# Patient Record
Sex: Male | Born: 1950
Health system: Southern US, Community
[De-identification: ages and names within clinical notes are randomized; demographics above are authoritative.]

## PROBLEM LIST (undated history)

## (undated) DIAGNOSIS — R7303 Prediabetes: Secondary | ICD-10-CM

## (undated) DIAGNOSIS — M546 Pain in thoracic spine: Secondary | ICD-10-CM

## (undated) DIAGNOSIS — F17200 Nicotine dependence, unspecified, uncomplicated: Secondary | ICD-10-CM

## (undated) DIAGNOSIS — M5137 Other intervertebral disc degeneration, lumbosacral region: Secondary | ICD-10-CM

## (undated) DIAGNOSIS — N281 Cyst of kidney, acquired: Secondary | ICD-10-CM

## (undated) DIAGNOSIS — M1711 Unilateral primary osteoarthritis, right knee: Secondary | ICD-10-CM

## (undated) DIAGNOSIS — R030 Elevated blood-pressure reading, without diagnosis of hypertension: Secondary | ICD-10-CM

## (undated) DIAGNOSIS — R911 Solitary pulmonary nodule: Secondary | ICD-10-CM

## (undated) DIAGNOSIS — N400 Enlarged prostate without lower urinary tract symptoms: Secondary | ICD-10-CM

## (undated) DIAGNOSIS — J45909 Unspecified asthma, uncomplicated: Secondary | ICD-10-CM

## (undated) DIAGNOSIS — M1712 Unilateral primary osteoarthritis, left knee: Secondary | ICD-10-CM

## (undated) DIAGNOSIS — B351 Tinea unguium: Secondary | ICD-10-CM

## (undated) DIAGNOSIS — N135 Crossing vessel and stricture of ureter without hydronephrosis: Secondary | ICD-10-CM

## (undated) DIAGNOSIS — S3992XA Unspecified injury of lower back, initial encounter: Secondary | ICD-10-CM

## (undated) DIAGNOSIS — E785 Hyperlipidemia, unspecified: Secondary | ICD-10-CM

## (undated) DIAGNOSIS — I10 Essential (primary) hypertension: Secondary | ICD-10-CM

## (undated) HISTORY — DX: Crossing vessel and stricture of ureter without hydronephrosis: N13.5

## (undated) HISTORY — DX: Essential (primary) hypertension: I10

## (undated) HISTORY — DX: Cyst of kidney, acquired: N28.1

## (undated) HISTORY — DX: Solitary pulmonary nodule: R91.1

## (undated) HISTORY — DX: Hyperlipidemia, unspecified: E78.5

## (undated) HISTORY — DX: Tinea unguium: B35.1

## (undated) HISTORY — DX: Prediabetes: R73.03

## (undated) HISTORY — DX: Unspecified asthma, uncomplicated: J45.909

## (undated) HISTORY — PX: WISDOM TOOTH EXTRACTION: SHX21

## (undated) HISTORY — DX: Unilateral primary osteoarthritis, left knee: M17.12

## (undated) HISTORY — DX: Benign prostatic hyperplasia without lower urinary tract symptoms: N40.0

## (undated) HISTORY — DX: Other intervertebral disc degeneration, lumbosacral region: M51.37

## (undated) HISTORY — DX: Unilateral primary osteoarthritis, right knee: M17.11

## (undated) HISTORY — DX: Pain in thoracic spine: M54.6

## (undated) HISTORY — DX: Elevated blood-pressure reading, without diagnosis of hypertension: R03.0

## (undated) HISTORY — DX: Nicotine dependence, unspecified, uncomplicated: F17.200

---

## 1998-03-20 ENCOUNTER — Encounter: Admission: RE | Admit: 1998-03-20 | Discharge: 1998-03-20 | Payer: Self-pay | Admitting: Family Medicine

## 1998-06-07 ENCOUNTER — Encounter: Admission: RE | Admit: 1998-06-07 | Discharge: 1998-06-07 | Payer: Self-pay | Admitting: Family Medicine

## 1998-12-25 ENCOUNTER — Encounter: Admission: RE | Admit: 1998-12-25 | Discharge: 1998-12-25 | Payer: Self-pay | Admitting: Sports Medicine

## 2001-05-18 ENCOUNTER — Encounter: Admission: RE | Admit: 2001-05-18 | Discharge: 2001-05-18 | Payer: Self-pay | Admitting: Family Medicine

## 2002-11-11 ENCOUNTER — Encounter: Admission: RE | Admit: 2002-11-11 | Discharge: 2002-11-11 | Payer: Self-pay | Admitting: Sports Medicine

## 2007-09-14 ENCOUNTER — Emergency Department (HOSPITAL_COMMUNITY): Admission: EM | Admit: 2007-09-14 | Discharge: 2007-09-14 | Payer: Self-pay | Admitting: Emergency Medicine

## 2007-11-29 ENCOUNTER — Emergency Department (HOSPITAL_COMMUNITY): Admission: EM | Admit: 2007-11-29 | Discharge: 2007-11-29 | Payer: Self-pay | Admitting: Family Medicine

## 2007-12-01 ENCOUNTER — Ambulatory Visit: Payer: Self-pay | Admitting: Family Medicine

## 2007-12-02 ENCOUNTER — Encounter: Payer: Self-pay | Admitting: *Deleted

## 2007-12-06 ENCOUNTER — Telehealth: Payer: Self-pay | Admitting: *Deleted

## 2007-12-06 ENCOUNTER — Encounter: Admission: RE | Admit: 2007-12-06 | Discharge: 2007-12-06 | Payer: Self-pay | Admitting: Sports Medicine

## 2007-12-06 ENCOUNTER — Ambulatory Visit: Payer: Self-pay | Admitting: Family Medicine

## 2007-12-13 ENCOUNTER — Ambulatory Visit: Payer: Self-pay | Admitting: Family Medicine

## 2007-12-13 ENCOUNTER — Encounter: Payer: Self-pay | Admitting: Family Medicine

## 2007-12-14 ENCOUNTER — Encounter: Admission: RE | Admit: 2007-12-14 | Discharge: 2008-02-08 | Payer: Self-pay | Admitting: Family Medicine

## 2007-12-21 ENCOUNTER — Encounter: Payer: Self-pay | Admitting: Family Medicine

## 2007-12-21 ENCOUNTER — Ambulatory Visit: Payer: Self-pay | Admitting: Family Medicine

## 2007-12-21 DIAGNOSIS — M545 Low back pain, unspecified: Secondary | ICD-10-CM | POA: Insufficient documentation

## 2007-12-29 ENCOUNTER — Telehealth: Payer: Self-pay | Admitting: Family Medicine

## 2007-12-29 ENCOUNTER — Telehealth: Payer: Self-pay | Admitting: *Deleted

## 2007-12-30 ENCOUNTER — Encounter: Payer: Self-pay | Admitting: Family Medicine

## 2008-01-03 ENCOUNTER — Encounter: Payer: Self-pay | Admitting: Family Medicine

## 2008-01-12 ENCOUNTER — Encounter: Payer: Self-pay | Admitting: Family Medicine

## 2008-01-13 ENCOUNTER — Encounter: Payer: Self-pay | Admitting: *Deleted

## 2008-01-17 ENCOUNTER — Ambulatory Visit: Payer: Self-pay | Admitting: Family Medicine

## 2008-01-17 ENCOUNTER — Ambulatory Visit (HOSPITAL_COMMUNITY): Admission: RE | Admit: 2008-01-17 | Discharge: 2008-01-17 | Payer: Self-pay | Admitting: Emergency Medicine

## 2008-01-18 ENCOUNTER — Telehealth: Payer: Self-pay | Admitting: Family Medicine

## 2008-01-18 ENCOUNTER — Encounter: Payer: Self-pay | Admitting: Family Medicine

## 2008-01-21 ENCOUNTER — Encounter: Payer: Self-pay | Admitting: Family Medicine

## 2008-03-03 ENCOUNTER — Telehealth: Payer: Self-pay | Admitting: Family Medicine

## 2008-03-10 ENCOUNTER — Emergency Department (HOSPITAL_COMMUNITY): Admission: EM | Admit: 2008-03-10 | Discharge: 2008-03-10 | Payer: Self-pay | Admitting: Family Medicine

## 2008-03-14 ENCOUNTER — Ambulatory Visit: Payer: Self-pay | Admitting: Family Medicine

## 2008-06-09 HISTORY — PX: COLONOSCOPY: SHX174

## 2009-03-17 ENCOUNTER — Encounter (INDEPENDENT_AMBULATORY_CARE_PROVIDER_SITE_OTHER): Payer: Self-pay | Admitting: *Deleted

## 2009-03-17 DIAGNOSIS — F17200 Nicotine dependence, unspecified, uncomplicated: Secondary | ICD-10-CM | POA: Insufficient documentation

## 2009-03-17 DIAGNOSIS — Z87891 Personal history of nicotine dependence: Secondary | ICD-10-CM | POA: Insufficient documentation

## 2009-03-17 HISTORY — DX: Nicotine dependence, unspecified, uncomplicated: F17.200

## 2009-06-09 DIAGNOSIS — S3992XA Unspecified injury of lower back, initial encounter: Secondary | ICD-10-CM

## 2009-06-09 HISTORY — DX: Unspecified injury of lower back, initial encounter: S39.92XA

## 2009-12-24 ENCOUNTER — Emergency Department (HOSPITAL_COMMUNITY): Admission: EM | Admit: 2009-12-24 | Discharge: 2009-12-24 | Payer: Self-pay | Admitting: Emergency Medicine

## 2009-12-28 ENCOUNTER — Ambulatory Visit: Payer: Self-pay | Admitting: Internal Medicine

## 2009-12-28 ENCOUNTER — Ambulatory Visit (HOSPITAL_COMMUNITY): Admission: RE | Admit: 2009-12-28 | Discharge: 2009-12-28 | Payer: Self-pay | Admitting: Internal Medicine

## 2009-12-28 DIAGNOSIS — M51379 Other intervertebral disc degeneration, lumbosacral region without mention of lumbar back pain or lower extremity pain: Secondary | ICD-10-CM

## 2009-12-28 DIAGNOSIS — M5137 Other intervertebral disc degeneration, lumbosacral region: Secondary | ICD-10-CM

## 2009-12-28 DIAGNOSIS — N400 Enlarged prostate without lower urinary tract symptoms: Secondary | ICD-10-CM

## 2009-12-28 DIAGNOSIS — M25559 Pain in unspecified hip: Secondary | ICD-10-CM

## 2009-12-28 DIAGNOSIS — R9389 Abnormal findings on diagnostic imaging of other specified body structures: Secondary | ICD-10-CM

## 2009-12-28 HISTORY — DX: Other intervertebral disc degeneration, lumbosacral region without mention of lumbar back pain or lower extremity pain: M51.379

## 2009-12-28 HISTORY — DX: Other intervertebral disc degeneration, lumbosacral region: M51.37

## 2009-12-28 HISTORY — DX: Benign prostatic hyperplasia without lower urinary tract symptoms: N40.0

## 2009-12-31 ENCOUNTER — Encounter: Payer: Self-pay | Admitting: Internal Medicine

## 2010-01-07 ENCOUNTER — Telehealth: Payer: Self-pay | Admitting: Internal Medicine

## 2010-02-12 ENCOUNTER — Ambulatory Visit: Payer: Self-pay | Admitting: Internal Medicine

## 2010-02-12 DIAGNOSIS — M546 Pain in thoracic spine: Secondary | ICD-10-CM | POA: Insufficient documentation

## 2010-02-12 HISTORY — DX: Pain in thoracic spine: M54.6

## 2010-02-13 ENCOUNTER — Ambulatory Visit: Payer: Self-pay | Admitting: Internal Medicine

## 2010-02-14 ENCOUNTER — Encounter: Payer: Self-pay | Admitting: Internal Medicine

## 2010-03-04 ENCOUNTER — Encounter: Payer: Self-pay | Admitting: Internal Medicine

## 2010-03-20 ENCOUNTER — Encounter
Admission: RE | Admit: 2010-03-20 | Discharge: 2010-05-28 | Payer: Self-pay | Source: Home / Self Care | Attending: Physical Medicine and Rehabilitation | Admitting: Physical Medicine and Rehabilitation

## 2010-04-30 ENCOUNTER — Ambulatory Visit (HOSPITAL_COMMUNITY)
Admission: RE | Admit: 2010-04-30 | Discharge: 2010-04-30 | Payer: Self-pay | Source: Home / Self Care | Admitting: Physical Medicine and Rehabilitation

## 2010-05-06 ENCOUNTER — Encounter: Payer: Self-pay | Admitting: Internal Medicine

## 2010-05-27 ENCOUNTER — Encounter: Payer: Self-pay | Admitting: Internal Medicine

## 2010-06-09 HISTORY — PX: OTHER SURGICAL HISTORY: SHX169

## 2010-06-11 ENCOUNTER — Ambulatory Visit (HOSPITAL_COMMUNITY)
Admission: RE | Admit: 2010-06-11 | Discharge: 2010-06-11 | Payer: Self-pay | Source: Home / Self Care | Attending: Urology | Admitting: Urology

## 2010-06-18 ENCOUNTER — Encounter: Payer: Self-pay | Admitting: Internal Medicine

## 2010-06-28 ENCOUNTER — Encounter: Payer: Self-pay | Admitting: Internal Medicine

## 2010-07-05 LAB — CBC
HCT: 41.8 % (ref 39.0–52.0)
Hemoglobin: 14 g/dL (ref 13.0–17.0)
MCHC: 33.5 g/dL (ref 30.0–36.0)
MCV: 85.7 fL (ref 78.0–100.0)
RDW: 13.4 % (ref 11.5–15.5)

## 2010-07-05 LAB — BASIC METABOLIC PANEL
BUN: 16 mg/dL (ref 6–23)
CO2: 28 mEq/L (ref 19–32)
GFR calc non Af Amer: >60 mL/min (ref >60)
Glucose, Bld: 101 mg/dL — ABNORMAL HIGH (ref 70–99)
Potassium: 4.9 mEq/L (ref 3.5–5.1)

## 2010-07-09 NOTE — Letter (Signed)
Summary: Out of Work  [facility]  [address]   [place], [place] [address]   Phone: [phone]  Fax: [phone]    January 18, 2008   Employee:  Samuel Gross [facility]    To Whom It May Concern:   For Medical reasons, please excuse the above named employee from work for the following dates:  Start:   May return to work 4 hours on 01/19/08, then to begin full time on 01/24/08.     If you need additional information, please feel free to contact our office.         Sincerely,    Luretha Murphy NP

## 2010-07-09 NOTE — Assessment & Plan Note (Signed)
Summary: Ongoing back pain-? referral/scm   Vital Signs:  Patient profile:   60 year old male Weight:      154 pounds Temp:     99.2 degrees F oral Pulse rate:   76 / minute Resp:     16 per minute BP sitting:   138 / 80  (left arm) Cuff size:   large  Vitals Entered By: Shonna Chock CMA (February 12, 2010 3:48 PM) CC: Ongoin back pain-? referral, Back pain   CC:  Ongoin back pain-? referral and Back pain.  History of Present Illness: Back Pain      This is a 60 year old man who presents with Back pain intermittently for 2-3 years. Initially it was in  R low back after lifting @ work. He was out of work 12 weeks ; he was in Physical Therapy for 4-5 weeks. .  The patient reports loss of sensation intermittently in L thigh, but denies fever, chills, weakness, fecal incontinence, urinary incontinence, urinary retention, dysuria, inability to work, and inability to care for self.  The pain is located in the left low back.  The pain  was exacerbated  at work necessitating job absence for 1 day two weeks ago . 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 The pain is made worse by standing or lifting @ work  and activity.  The pain is made better by inactivity, NSAID medications, muscle relaxants, and opioids ; but only after 6 hrs.  Risk factors for serious underlying conditions include age >= 50 years. Prior hip & LS spine reprts reveal mild degenerative changes.  Current Medications (verified): 1)  Ibuprofen 800 Mg  Tabs (Ibuprofen) .... Two Times A Day 2)  Flexeril 10 Mg  Tabs (Cyclobenzaprine Hcl) .... Three Times A Day As Needed 3)  Oxycodone-Acetaminophen 5-325 Mg Tabs (Oxycodone-Acetaminophen) .Marland Kitchen.. 1 By Mouth Every 4-6 Hours As Needed  Allergies (verified): No Known Drug Allergies  Review of Systems General:  Denies weight loss. GI:  Denies abdominal pain, bloody stools, and dark tarry stools. GU:  Denies discharge and hematuria. Neuro:  Denies brief paralysis, disturbances in  coordination, falling down, and poor balance.  Physical Exam  General:  Thin but well-nourished,in no acute distress; alert,appropriate and cooperative throughout examination Abdomen:  Bowel sounds positive,abdomen soft and non-tender without masses, organomegaly or hernias noted. Msk:  Lay down & sat up w/o help . R thoracic musculature larger than L (R/O subclinical scoliosis) Neurologic:  alert & oriented X3, strength normal in all extremities except minimal weakness L thigh to opposition , and DTRs symmetrical and normal.   Cervical Nodes:  No lymphadenopathy noted Axillary Nodes:  No palpable lymphadenopathy Psych:  memory intact for recent and remote, normally interactive, and good eye contact.     Detailed Back/Spine Exam  Gait:    Normal heel-toe gait pattern bilaterally.    Skin:    Intact with no erythema; no scarring.    Inspection:     normal alignment of leg, ankle, hindfoot, and foot.   Vascular:    dorsalis pedis and posterior tibial pulses 2+ and symmetric   Impression & Recommendations:  Problem # 1:  BACK PAIN, THORACIC REGION (ICD-724.1)  R/O Scoliosis His updated medication list for this problem includes:    Ibuprofen 800 Mg Tabs (Ibuprofen) .Marland Kitchen..Marland Kitchen Two times a day    Flexeril 10 Mg Tabs (Cyclobenzaprine hcl) .Marland Kitchen... 1/2 two times a day & 1/2 -1 at bedtime as needed when not working  Oxycodone-acetaminophen 5-325 Mg Tabs (Oxycodone-acetaminophen) .Marland Kitchen... 1 by mouth every 4-6 hours as needed  Orders: T-Thoracic Spine 2 Views [phone]) Orthopedic Referral (Ortho)  Problem # 2:  BACK PAIN, LUMBAR (ICD-724.2)  Mild degenerative changes on Xray 2009 His updated medication list for this problem includes:    Ibuprofen 800 Mg Tabs (Ibuprofen) .Marland Kitchen..Marland Kitchen Two times a day    Flexeril 10 Mg Tabs (Cyclobenzaprine hcl) .Marland Kitchen... 1/2 two times a day & 1/2 -1 at bedtime as needed when not working    Oxycodone-acetaminophen 5-325 Mg Tabs (Oxycodone-acetaminophen) .Marland Kitchen... 1 by  mouth every 4-6 hours as needed  Orders: Orthopedic Referral (Ortho)  Complete Medication List: 1)  Ibuprofen 800 Mg Tabs (Ibuprofen) .... Two times a day 2)  Flexeril 10 Mg Tabs (Cyclobenzaprine hcl) .... 1/2 two times a day & 1/2 -1 at bedtime as needed when not working 3)  Oxycodone-acetaminophen 5-325 Mg Tabs (Oxycodone-acetaminophen) .Marland Kitchen.. 1 by mouth every 4-6 hours as needed  Patient Instructions: 1)  Please see Dr Ethelene Hal as scheduled. Prescriptions: FLEXERIL 10 MG  TABS (CYCLOBENZAPRINE HCL) 1/2 two times a day & 1/2 -1 at bedtime as needed when not working  #30 x 0   Entered and Authorized by:   Marga Melnick MD   Signed by:   Marga Melnick MD on 02/12/2010   Method used:   Print then Give to Patient   RxID:   [id number] OXYCODONE-ACETAMINOPHEN 5-325 MG TABS (OXYCODONE-ACETAMINOPHEN) 1 by mouth every 4-6 hours as needed  #30 x 0   Entered and Authorized by:   Marga Melnick MD   Signed by:   Marga Melnick MD on 02/12/2010   Method used:   Print then Give to Patient   RxID:   [id number]

## 2010-07-09 NOTE — Consult Note (Signed)
Summary: [facility]  [facility]   Imported By: Lanelle Bal 03/15/2010 13:50:20  _____________________________________________________________________  External Attachment:    Type:   Image     Comment:   External Document

## 2010-07-09 NOTE — Assessment & Plan Note (Signed)
Summary: new to est//kn   Vital Signs:  Patient profile:   60 year old male Height:      70.75 inches Weight:      158.8 pounds BMI:     22.39 Temp:     98.7 degrees F oral Pulse rate:   72 / minute Resp:     14 per minute BP sitting:   118 / 76  (left arm) Cuff size:   large  Vitals Entered By: Shonna Chock CMA (December 28, 2009 2:26 PM)  CC: New patient establish CPX, f/u from ER on lower back pain (seen Monday), frequent urination durning the night (not a steady flow), General Medical Evaluation, Lower Extremity Joint pain   CC:  New patient establish CPX, f/u from ER on lower back pain (seen Monday), frequent urination durning the night (not a steady flow), General Medical Evaluation, and Lower Extremity Joint pain.  History of Present Illness: Samuel Gross is here to establish as a new patient; he has been having L flank/ hip pain X 6 months.   The patient reports weakness, but denies swelling, redness, giving away, locking, popping, stiffness for >1 hr, and decreased ROM.  The pain is located @ & above the left hip.  The pain began gradually and with no injury.  The pain is described as aching, intermittent, and occuring mainly  at rest. No evaluation to date ;he had MRI of LS spine 12 months for R sided injury. He was out of work 8 weeks.  The patient denies the following symptoms: fever, rash, photosensitivity, eye symptoms, diarrhea, and dysuria.    Preventive Screening-Counseling & Management  Caffeine-Diet-Exercise     Does Patient Exercise: yes  Current Medications (verified): 1)  Ibuprofen 800 Mg  Tabs (Ibuprofen) .... Two Times A Day 2)  Flexeril 10 Mg  Tabs (Cyclobenzaprine Hcl) .... Three Times A Day As Needed 3)  Oxycodone-Acetaminophen 5-325 Mg Tabs (Oxycodone-Acetaminophen) .Marland Kitchen.. 1 By Mouth Every 4-6 Hours As Needed  Allergies (verified): No Known Drug Allergies  Past History:  Past Medical History: RAD  post "walking PNA" , as needed albuterol; DDD LS spine ( disc  extrusion L3-4; protrusion L4-5) 2009; L peri renal cyst (incidental finding )2009  Past Surgical History: Wisdom Teeth Extraction  Family History: DM - M died of complications, HTN - F, 3 bro, 2 sis  Social History: Married to [place]; Smokes 1/2 PPD; Alcohol 2-3 mixed drinks on Saturdays. Occupation:Transporter @  [employer] Regular exercise-yes on job Does Patient Exercise:  yes  Review of Systems  The patient denies anorexia, vision loss, decreased hearing, hoarseness, headaches, abdominal pain, melena, hematochezia, severe indigestion/heartburn, suspicious skin lesions, depression, unusual weight change, abnormal bleeding, enlarged lymph nodes, and angioedema.   General:  Denies chills, sweats, and weight loss. Eyes:  Denies discharge, eye pain, red eye, and vision loss-both eyes. Resp:  Denies chest pain with inspiration, cough, coughing up blood, shortness of breath, and sputum productive; Occasioanl wheezing when supine; no albuterol use x 12 months. GU:  Denies discharge, hematuria, and incontinence. Neuro:  Complains of numbness and tingling; denies disturbances in coordination and poor balance; N&T slightly L hip area.  Physical Exam  General:  Thin but well-nourished; alert,appropriate and cooperative throughout examination Head:  Normocephalic and atraumatic without obvious abnormalities. No apparent alopecia ; moustache Eyes:  No corneal or conjunctival inflammation noted. Perrla. Funduscopic exam benign, without hemorrhages, exudates or papilledema.  Ears:  External ear exam shows no significant lesions or deformities.  Otoscopic  examination reveals wax bilaterally.Hearing is grossly normal bilaterally. Nose:  External nasal examination shows no deformity or inflammation. Nasal mucosa are pink and moist without lesions or exudates. Mouth:  Oral mucosa and oropharynx without lesions or exudates.  Upper partial Neck:  No deformities, masses, or tenderness noted. R thyroid  lobe > L, no nodules Lungs:  Normal respiratory effort, chest expands symmetrically. Lungs are clear to auscultation, no crackles or wheezes but BS slightly decreased Heart:  normal rate, regular rhythm, no gallop, no rub, no JVD, no HJR, and grade 1 /6 systolic murmur.   Abdomen:  Bowel sounds positive,abdomen soft and non-tender without masses, organomegaly or hernias noted. Rectal:  No external abnormalities noted. Normal sphincter tone. No rectal masses or tenderness. Genitalia:  Testes bilaterally descended without nodularity, tenderness or masses. No scrotal masses or lesions. No penis lesions or urethral discharge. Prostate:  no nodules ; R  1.5 -2+ asymmetrical enlargement.  L WNL.  Msk:  No deformity or scoliosis noted of thoracic or lumbar spine.   Pulses:  R and L carotid,radial,dorsalis pedis and posterior tibial pulses are full and equal bilaterally Extremities:  No clubbing, cyanosis, edema, or deformity noted with normal full range of motion of all joints.  neg SLR  Neurologic:  alert & oriented X3, strength normal in all extremities, and DTRs symmetrical and normal.   Skin:  Intact without suspicious lesions or rashes Cervical Nodes:  No lymphadenopathy noted Axillary Nodes:  No palpable lymphadenopathy Inguinal Nodes:  No significant adenopathy Psych:  memory intact for recent and remote, normally interactive, and good eye contact.     Impression & Recommendations:  Problem # 1:  ROUTINE GENERAL MEDICAL EXAM@HEALTH  CARE FACL (ICD-V70.0)  Orders: EKG w/ Interpretation (93000)  Problem # 2:  HIP PAIN, LEFT (ICD-719.45)  His updated medication list for this problem includes:    Ibuprofen 800 Mg Tabs (Ibuprofen) .Marland Kitchen..Marland Kitchen Two times a day    Flexeril 10 Mg Tabs (Cyclobenzaprine hcl) .Marland Kitchen... Three times a day as needed    Oxycodone-acetaminophen 5-325 Mg Tabs (Oxycodone-acetaminophen) .Marland Kitchen... 1 by mouth every 4-6 hours as needed  Orders: T-Hip Comp Left Min 2-views  (73510TC)  Problem # 3:  HYPERPLASIA PROSTATE UNS W/O UR OBST & OTH LUTS (ICD-600.90)  Problem # 4:  DISC DISEASE, LUMBAR (ICD-722.52) PMH of  Problem # 5:  NONSPECIFIC ABN FINDING RAD & OTH EXAM GU ORGAN (ICD-793.5)  L peri renal cyst, incidental finding  Complete Medication List: 1)  Ibuprofen 800 Mg Tabs (Ibuprofen) .... Two times a day 2)  Flexeril 10 Mg Tabs (Cyclobenzaprine hcl) .... Three times a day as needed 3)  Oxycodone-acetaminophen 5-325 Mg Tabs (Oxycodone-acetaminophen) .Marland Kitchen.. 1 by mouth every 4-6 hours as needed  Patient Instructions: 1)   Orthopedic consult if Xrays are negative.Schedule fasting labs : 2)  BMP ; 3)  Hepatic Panel ; 4)  Lipid Panel ; 5)  TSH ; 6)  CBC w/ Diff ; 7)  PSA; 8)  Urine-dip .

## 2010-07-09 NOTE — Progress Notes (Signed)
Summary: Refill Request: Controlled meds  Phone Note Refill Request Message from:  Patient on January 07, 2010 9:23 AM  Refills Requested: Medication #1:  OXYCODONE-ACETAMINOPHEN 5-325 MG TABS 1 by mouth every 4-6 hours as needed.   Dosage confirmed as above?Dosage Confirmed   Supply Requested: 1 month  Medication #2:  IBUPROFEN 800 MG  TABS two times a day   Dosage confirmed as above?Dosage Confirmed   Supply Requested: 1 month  Medication #3:  FLEXERIL 10 MG  TABS three times a day as needed   Dosage confirmed as above?Dosage Confirmed   Supply Requested: 1 month [facility] on [address].  Next Appointment Scheduled: none Initial call taken by: Lavell Islam,  January 07, 2010 9:24 AM  Follow-up for Phone Call        Dr.Shaunie Boehm please advise on refill request, we have never filled these meds -? dispense number Follow-up by: Shonna Chock CMA,  January 07, 2010 11:18 AM  Additional Follow-up for Phone Call Additional follow up Details #1::        Each can  be filled once @ # 30; but I will refer him to  a back specialist as we discussed @ his appt. I recommend  Dr Sheran Luz unless he has a preference. Alos see lab results Additional Follow-up by: Marga Melnick MD,  January 07, 2010 1:04 PM    Additional Follow-up for Phone Call Additional follow up Details #2::    I spoke with Bjorn Loser, patient's wife as indicated on his file-ok to speak with wife, and gave her Dr.Dwayne Begay's instruction to pass to patient Follow-up by: Shonna Chock CMA,  January 07, 2010 2:52 PM  Prescriptions: OXYCODONE-ACETAMINOPHEN 5-325 MG TABS (OXYCODONE-ACETAMINOPHEN) 1 by mouth every 4-6 hours as needed  #30 x 0   Entered by:   Shonna Chock CMA   Authorized by:   Marga Melnick MD   Signed by:   Shonna Chock CMA on 01/07/2010   Method used:   Printed then faxed to ...         RxID:   [id number] FLEXERIL 10 MG  TABS (CYCLOBENZAPRINE HCL) three times a day as needed  #30 x 0   Entered by:   Shonna Chock CMA   Authorized by:   Marga Melnick MD   Signed by:   Shonna Chock CMA on 01/07/2010   Method used:   Printed then faxed to ...         RxID:   [id number] IBUPROFEN 800 MG  TABS (IBUPROFEN) two times a day  #30 x 0   Entered by:   Shonna Chock CMA   Authorized by:   Marga Melnick MD   Signed by:   Shonna Chock CMA on 01/07/2010   Method used:   Printed then faxed to ...         RxID:   [id number]   Appended Document: Refill Request: Controlled meds   Left message on VM informing patient rx is at the front for pick-up./[facility] CMA  January 07, 2010 5:25 PM  Prescriptions: OXYCODONE-ACETAMINOPHEN 5-325 MG TABS (OXYCODONE-ACETAMINOPHEN) 1 by mouth every 4-6 hours as needed  #30 x 0   Entered by:   Shonna Chock CMA   Authorized by:   Marga Melnick MD   Signed by:   Shonna Chock CMA on 01/07/2010   Method used:   Reprint   RxID:   [id number]

## 2010-07-11 ENCOUNTER — Other Ambulatory Visit: Payer: Self-pay | Admitting: Urology

## 2010-07-11 ENCOUNTER — Inpatient Hospital Stay (HOSPITAL_COMMUNITY): Payer: 59

## 2010-07-11 ENCOUNTER — Inpatient Hospital Stay (HOSPITAL_COMMUNITY)
Admission: RE | Admit: 2010-07-11 | Discharge: 2010-07-13 | DRG: 660 | Disposition: A | Discharge status: Home / Self Care | Payer: 59 | Attending: Urology | Admitting: Urology

## 2010-07-11 DIAGNOSIS — Q6239 Other obstructive defects of renal pelvis and ureter: Principal | ICD-10-CM

## 2010-07-11 DIAGNOSIS — F17200 Nicotine dependence, unspecified, uncomplicated: Secondary | ICD-10-CM | POA: Diagnosis present

## 2010-07-11 DIAGNOSIS — N1330 Unspecified hydronephrosis: Secondary | ICD-10-CM | POA: Diagnosis present

## 2010-07-11 LAB — BASIC METABOLIC PANEL
BUN: 12 mg/dL (ref 6–23)
Calcium: 8.2 mg/dL — ABNORMAL LOW (ref 8.4–10.5)
Creatinine, Ser: 1.31 mg/dL (ref 0.4–1.5)
GFR calc non Af Amer: 56 mL/min — ABNORMAL LOW (ref >60)
Glucose, Bld: 106 mg/dL — ABNORMAL HIGH (ref 70–99)
Potassium: 4.2 mEq/L (ref 3.5–5.1)

## 2010-07-11 LAB — HEMOGLOBIN AND HEMATOCRIT, BLOOD
HCT: 36.9 % — ABNORMAL LOW (ref 39.0–52.0)
Hemoglobin: 12.2 g/dL — ABNORMAL LOW (ref 13.0–17.0)

## 2010-07-11 LAB — ABO/RH: ABO/RH(D): O POS

## 2010-07-11 LAB — TYPE AND SCREEN: Antibody Screen: NEGATIVE

## 2010-07-11 NOTE — Letter (Signed)
Summary: [facility]  [facility]   Imported By: Lanelle Bal 06/12/2010 11:26:31  _____________________________________________________________________  External Attachment:    Type:   Image     Comment:   External Document

## 2010-07-11 NOTE — Consult Note (Signed)
Summary: [facility]  [facility]   Imported By: Lanelle Bal 05/20/2010 13:47:53  _____________________________________________________________________  External Attachment:    Type:   Image     Comment:   External Document

## 2010-07-11 NOTE — Letter (Signed)
Summary: [facility]  [facility]   Imported By: Lanelle Bal 06/27/2010 11:53:06  _____________________________________________________________________  External Attachment:    Type:   Image     Comment:   External Document

## 2010-07-12 LAB — BASIC METABOLIC PANEL
BUN: 10 mg/dL (ref 6–23)
CO2: 28 mEq/L (ref 19–32)
Calcium: 8.2 mg/dL — ABNORMAL LOW (ref 8.4–10.5)
Creatinine, Ser: 1.24 mg/dL (ref 0.4–1.5)
GFR calc non Af Amer: 60 mL/min — ABNORMAL LOW (ref >60)
Glucose, Bld: 140 mg/dL — ABNORMAL HIGH (ref 70–99)
Sodium: 137 mEq/L (ref 135–145)

## 2010-07-12 LAB — HEMOGLOBIN AND HEMATOCRIT, BLOOD: Hemoglobin: 10.8 g/dL — ABNORMAL LOW (ref 13.0–17.0)

## 2010-07-13 LAB — CREATININE, FLUID (PLEURAL, PERITONEAL, JP DRAINAGE)

## 2010-07-13 NOTE — Op Note (Signed)
Samuel Gross, Samuel Gross                ACCOUNT NO.:  [id number]  MEDICAL RECORD NO.:  [id number]           PATIENT TYPE:  I  LOCATION:  X001                         FACILITY:  [facility]  PHYSICIAN:  Heloise Purpura, MD      DATE OF BIRTH:  07/12/1950  DATE OF PROCEDURE:  07/11/2010 DATE OF DISCHARGE:                              OPERATIVE REPORT   PREOPERATIVE DIAGNOSIS:  Left ureteropelvic junction obstruction.  POSTOPERATIVE DIAGNOSIS:  Left ureteropelvic junction obstruction.  PROCEDURE: 1. Cystoscopy. 2. Left retrograde pyelography with interpretation. 3. Left ureteral stent (8 x 26). 4. Left robotic-assisted laparoscopic dismembered pyeloplasty.  SURGEON:  Heloise Purpura, MD  ASSISTANT:  Delia Chimes, [facility]  ANESTHESIA:  General endotracheal.  COMPLICATIONS:  None.  ESTIMATED BLOOD LOSS:  600 mL.  INTRAVENOUS FLUIDS:  3400 mL of lactated Ringer's.  SPECIMEN:  Left ureteropelvic junction and renal pelvis.  DISPOSITION OF SPECIMEN:  To pathology.  DRAINS: 1. 14-French Foley catheter. 2. #15 Blake perinephric drain.  INDICATIONS FOR PROCEDURE:  Mr. Journey is a 60 year old gentleman who has had intermittent back pain localized to the left side.  He underwent an MRI for evaluation and was found to have incidentally detected left hydronephrosis.  Subsequent evaluation revealed a left ureteropelvic junction obstruction with decreased split renal function and the etiology appeared to be related to lower pole crossing renal vessels. It was felt that his pain could potentially be related to this finding, and considering that he did have decreased split renal function from the left kidney, it was recommended that he consider surgical repair.  The potential risks, complications, and alternative treatment options associated with the above procedures were discussed in detail and informed consent was obtained.  DESCRIPTION OF PROCEDURE:  The patient was taken to the operating  room and a general anesthetic was administered.  He was given preoperative antibiotics, placed in the dorsal lithotomy position, and prepped and draped in the usual sterile fashion.  Next, a preoperative time-out was performed.  A cystourethroscopy was then performed which revealed a normal anterior and posterior urethra.  Inspection of the bladder revealed no evidence of any bladder tumors, stones, or other mucosal pathology.  The ureteral orifices were identified in their normal anatomic position.  The left ureteral orifice was then intubated with a 6-French ureteral catheter and Omnipaque contrast was injected in a retrograde fashion.  This demonstrated a normal caliber ureter without evidence for filling defects.  At the proximal ureter, there was noted to be a tortuous area and an extremely dilated renal pelvis.  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  A 0.038 sensor guidewire was then advanced up into the renal pelvis without difficulty.  An 8 x 26 double-J ureteral stent was then advanced over the wire using Seldinger technique and positioned appropriately under fluoroscopic and cystoscopic guidance.  The wire was removed with a good curl noted in the renal pelvis as well as in the bladder.  A 14- French Foley catheter was then inserted into the bladder.  He was  then repositioned in the left modified flank position with care to pad all potential pressure points.  His abdomen was then prepped and draped in the usual sterile fashion and a second preoperative time-out was performed.  A site was then selected just to the left of the umbilicus for placement of the camera port.  This was placed using a standard open Hasson technique which allowed entry into the peritoneal cavity under direct vision without difficulty.  A 12-mm port was then placed and a 0-degree lens was used to inspect  the abdomen.  There were noted to be some omental adhesions in the left upper quadrant.  These did not interfere with placement of the left lower quadrant 8-mm robotic port or the 12-mm upper midline port.  Through these ports, the aforementioned adhesions were carefully taken down from the abdominal wall with laparoscopic scissors.  An additional 8-mm robotic port was then placed in the left upper quadrant with another one in the far left lateral abdominal wall.  All ports were placed under direct vision without difficulty.  The surgical cart was then docked.  With the aid of the cautery scissors, the colon was reflected medially allowing the space between the mesocolon and Gerota's fascia to be developed.  During this dissection, the mesentery was noted be very thin and the mesenteric window was created.  This was closed with a running 4-0 Vicryl suture. The ureter was then identified and was lifted anteriorly off the psoas muscle.  It was traced superiorly and there was noted to be a crossing renal artery and vein as seen on his preoperative CT scan.  The ureter was carefully freed from the surrounding structures both inferiorly and superiorly to these crossing vessels with care to preserve the periureteral blood supply.  The patient was noted have a very large and floppy renal pelvis which was carefully dissected free from the surrounding tissue.  The renal pelvis was dissected out in its entirety anteriorly and medially and posteriorly.  During the posterior dissection, venous bleeding was encountered.  This was well away from the main renal hilum or the lower pole renal vessels.  It was felt that this might be a segmental venous branch considering the malrotation of the kidney.  This was able to be controlled with figure-of-eight 4-0 Vicryl sutures, resulting in excellent hemostasis.  At the end of the case, additional Surgiflo was placed into this area as a precaution. However,  throughout the remainder of the case, this area was monitored and there did not appear to be any evidence of further bleeding. Attention was then turned to repair of the ureteropelvic junction.  The ureteropelvic junction was excised sharply and a reduction pyeloplasty of the renal pelvis was performed with a large portion of the medial aspect of the renal pelvis removed.  The patient was noted to have a renal calyx that extended fairly medially, again related to the malrotation of the kidney.  This was carefully identified and preserved. A 4-0 Vicryl running suture was then used to close the renal pelvis and the ureter was spatulated.  The dependent portion of the renal pelvis was then sewn to the ureter with two separate running 4-0 Vicryl sutures, both on the anterior and posterior aspect of the anastomosis after the stent had been placed back up into the renal pelvis.  This resulted in a tension-free and watertight anastomosis in the dependent portion of the renal pelvis.  In addition, this anastomosis was performed on the anterior side of  the crossing renal vessels.  A #15 Blake drain was brought through the far left lateral lower quadrant port site and positioned appropriately in the perinephric space.  This was secured to the skin with a nylon suture.  The 12-mm port sites were then closed laparoscopically with 0-Vicryl sutures.  All remaining ports were removed under direct vision and the pneumoperitoneum gas was expelled.  0.25% Marcaine was injected into each incision site which was then reapproximated at the skin level with 4-0 Monocryl subcuticular closures.  Dermabond was applied to the skin.  He tolerated the procedure well without complications.  He was able to be extubated and transferred to the recovery unit in satisfactory condition.     Heloise Purpura, MD     LB/MEDQ  D:  07/11/2010  T:  07/11/2010  Job:  [id number]  Electronically Signed by Heloise Purpura MD on  07/13/2010 06:34:24 AM

## 2010-07-16 ENCOUNTER — Encounter: Payer: Self-pay | Admitting: Internal Medicine

## 2010-07-17 NOTE — Letter (Signed)
Summary: [facility]   Imported By: Lanelle Bal 07/09/2010 13:17:05  _____________________________________________________________________  External Attachment:    Type:   Image     Comment:   External Document

## 2010-07-19 ENCOUNTER — Encounter: Payer: Self-pay | Admitting: Internal Medicine

## 2010-07-29 ENCOUNTER — Encounter: Payer: Self-pay | Admitting: Internal Medicine

## 2010-07-31 ENCOUNTER — Encounter: Payer: Self-pay | Admitting: Internal Medicine

## 2010-07-31 NOTE — Letter (Signed)
Summary: [facility]  [facility]   Imported By: Maryln Gottron 07/24/2010 12:58:38  _____________________________________________________________________  External Attachment:    Type:   Image     Comment:   External Document

## 2010-08-03 NOTE — Discharge Summary (Signed)
NAMESHAHIL, Samuel Gross                ACCOUNT NO.:  [id number]  MEDICAL RECORD NO.:  [id number]           PATIENT TYPE:  I  LOCATION:  1413                         FACILITY:  [facility]  PHYSICIAN:  Heloise Purpura, MD      DATE OF BIRTH:  1951-02-04  DATE OF ADMISSION:  07/11/2010 DATE OF DISCHARGE:  07/13/2010                              DISCHARGE SUMMARY   ADMISSION DIAGNOSIS:  Left ureteropelvic junction obstruction.  DISCHARGE DIAGNOSIS:  Left ureteropelvic junction obstruction.  HISTORY AND PHYSICAL:  For full details, please see admission history and physical.  Briefly, [name] is a 60 year old gentleman who was found to have left hydronephrosis, intermittent left-sided flank pain, and findings consistent with ureteropelvic junction obstruction on a nuclear medicine renography.  After discussing options for treatment, he elected to proceed with a left robotic-assisted laparoscopic dismembered pyeloplasty.  HOSPITAL COURSE:  On July 11, 2010, the patient was taken to the Operating Room and underwent the above procedure.  He tolerated this procedure well and without complications.  During the procedure, he did require an extensive reconstruction of his renal pelvis due to the excessive dilation of the renal pelvis in the extrarenal position.  He did have any ureteral stent placed at the time of the operation and postoperatively remained hemodynamically stable.  He was able to begin ambulating the night of surgery and gradually his diet was advanced without problems.  On the morning of postoperative day #1, he remained hemodynamically stable with hemoglobin of 10.8 and this remained stable throughout his hospital course.  His renal function also remained stable with a serum creatinine of 1.2 the day after surgery.  His Foley catheter was removed on the morning of postoperative day #1, and although he did not have excessive drainage out his perinephric drain, his fluid  creatinine from his drain returned elevated at 4.4 consistent with at least a small urine leak.  Therefore, his Foley catheter was replaced.  He maintained excellent urine output with minimal output from his perinephric drain.  The creatinine level from the drain was then checked on postoperative day #2 and remained elevated a 12.7.  He otherwise was doing well and remained afebrile and hemodynamically stable and was felt stable for discharge.  He was discharged home with both his Foley catheter and his perinephric drain.  DISPOSITION:  Home.  DISCHARGE MEDICATIONS:  He was instructed to resume his regular home medications and was given a prescription for Percocet to take as needed for pain.  DISCHARGE INSTRUCTIONS:  He was instructed to be ambulatory and specifically told to refrain from any heavy lifting, strenuous activity, or driving.  He was instructed on routine Foley catheter care and also instructed on how to empty his perinephric drain as well as instructed to keep the output total from the drain and the catheter.  FOLLOWUP:  He will follow up in the next few days to recheck his creatinine level from his drain and for further postoperative evaluation.     Heloise Purpura, MD     LB/MEDQ  D:  07/21/2010  T:  07/21/2010  Job:  [id number]  Electronically  Signed by Heloise Purpura MD on 08/03/2010 01:11:37 PM

## 2010-08-06 NOTE — Letter (Signed)
Summary: [facility]  [facility]   Imported By: Maryln Gottron 08/02/2010 10:07:54  _____________________________________________________________________  External Attachment:    Type:   Image     Comment:   External Document

## 2010-08-06 NOTE — Letter (Signed)
Summary: [facility]  [facility]   Imported By: Maryln Gottron 07/30/2010 15:18:22  _____________________________________________________________________  External Attachment:    Type:   Image     Comment:   External Document

## 2010-08-08 ENCOUNTER — Encounter: Payer: Self-pay | Admitting: Internal Medicine

## 2010-08-15 NOTE — Letter (Signed)
Summary: [facility]  [facility]   Imported By: Lanelle Bal 08/07/2010 09:12:06  _____________________________________________________________________  External Attachment:    Type:   Image     Comment:   External Document

## 2010-08-20 NOTE — Letter (Signed)
Summary: [facility]  [facility]   Imported By: Maryln Gottron 08/13/2010 13:17:39  _____________________________________________________________________  External Attachment:    Type:   Image     Comment:   External Document

## 2010-10-23 ENCOUNTER — Other Ambulatory Visit (HOSPITAL_COMMUNITY): Payer: Self-pay | Admitting: Urology

## 2010-10-23 DIAGNOSIS — N135 Crossing vessel and stricture of ureter without hydronephrosis: Secondary | ICD-10-CM

## 2010-12-03 ENCOUNTER — Encounter (HOSPITAL_COMMUNITY)
Admission: RE | Admit: 2010-12-03 | Discharge: 2010-12-03 | Disposition: A | Discharge status: Home / Self Care | Payer: 59 | Source: Ambulatory Visit | Attending: Urology | Admitting: Urology

## 2010-12-03 ENCOUNTER — Encounter (HOSPITAL_COMMUNITY): Payer: Self-pay

## 2010-12-03 DIAGNOSIS — N135 Crossing vessel and stricture of ureter without hydronephrosis: Secondary | ICD-10-CM | POA: Insufficient documentation

## 2010-12-03 MED ORDER — TECHNETIUM TC 99M MERTIATIDE
15.3000 | Freq: Once | INTRAVENOUS | Status: AC | PRN
  Administered 2010-12-03: 15.3 via INTRAVENOUS

## 2010-12-03 MED ORDER — FUROSEMIDE 10 MG/ML IJ SOLN: 36.0000 mg | Freq: Once | INTRAMUSCULAR | Status: DC

## 2011-04-30 ENCOUNTER — Encounter: Payer: Self-pay | Admitting: Internal Medicine

## 2011-05-08 ENCOUNTER — Ambulatory Visit (INDEPENDENT_AMBULATORY_CARE_PROVIDER_SITE_OTHER): Payer: 59 | Admitting: Internal Medicine

## 2011-05-08 ENCOUNTER — Encounter: Payer: Self-pay | Admitting: Internal Medicine

## 2011-05-08 VITALS — BP 144/86 | HR 70 | Temp 98.8°F | Resp 12 | Ht 71.0 in | Wt 158.4 lb

## 2011-05-08 DIAGNOSIS — R7309 Other abnormal glucose: Secondary | ICD-10-CM

## 2011-05-08 DIAGNOSIS — S335XXA Sprain of ligaments of lumbar spine, initial encounter: Secondary | ICD-10-CM

## 2011-05-08 DIAGNOSIS — N135 Crossing vessel and stricture of ureter without hydronephrosis: Secondary | ICD-10-CM | POA: Insufficient documentation

## 2011-05-08 DIAGNOSIS — Z0000 Encounter for general adult medical examination without abnormal findings: Secondary | ICD-10-CM

## 2011-05-08 DIAGNOSIS — Z833 Family history of diabetes mellitus: Secondary | ICD-10-CM

## 2011-05-08 HISTORY — DX: Crossing vessel and stricture of ureter without hydronephrosis: N13.5

## 2011-05-08 MED ORDER — TRAMADOL HCL 50 MG PO TABS: 50.0000 mg | ORAL_TABLET | Freq: Four times a day (QID) | ORAL | Status: AC | PRN

## 2011-05-08 NOTE — Progress Notes (Signed)
Subjective:    Patient ID: Samuel Gross, male    DOB: 1950/07/11, 60 y.o.   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  He also describes some ED; Viagra of no benefit in 2010.He denies obstructive symptoms.The role of smoking was discussed.      Objective:   Physical Exam Gen.: Thin but healthy and well-nourished in appearance. Alert, appropriate and cooperative throughout exam. Head: Normocephalic without obvious abnormalities;  No  alopecia  Eyes: No corneal or conjunctival inflammation noted. Pupils equal round reactive to light and accommodation. Fundal exam is benign without hemorrhages, exudate, papilledema. Extraocular motion intact. Vision grossly normal. Ears: External  ear exam reveals no significant lesions or deformities. Canals clear .TMs normal. Hearing is grossly normal bilaterally. Nose: External nasal exam reveals no deformity or inflammation. Nasal mucosa are pink and moist. No lesions or exudates noted. Mouth: Oral mucosa and oropharynx reveal no lesions or exudates. Upper partial Neck: No deformities, masses, or tenderness noted. Range of motion &  Thyroid normal. Lungs: Normal respiratory effort;  chest expands symmetrically. Lungs are clear to auscultation without rales, wheezes, or increased work of breathing. Heart: Normal rate and rhythm. Normal S1 and S2. No gallop, click, or rub. S 4 w/o murmur. Abdomen: Bowel sounds normal; abdomen soft and nontender. No masses, organomegaly or hernias noted. Genitalia/DRE: Dr Vernie Ammons   .                                                                                   Musculoskeletal/extremities: No deformity or scoliosis noted of  the thoracic or lumbar spine. No clubbing, cyanosis, edema, or deformity noted. Range of motion  normal .Tone & strength  normal.Joints normal. Nail health  Good.He is able to lie flat & sit up w/o help.Negative SLR bilaterally Vascular: Carotid, radial artery, dorsalis pedis and  posterior tibial pulses are full and equal. No bruits present. Neurologic: Alert and oriented x3. Deep tendon reflexes symmetrical and normal. Heel & toe walking normal         Skin: Intact without suspicious lesions or rashes. Lymph: No cervical, axillary lymphadenopathy present. Psych: Mood and affect are normal. Normally interactive  Assessment & Plan:  #1 comprehensive physical exam; no acute findings #2 see Problem List with Assessments & Recommendations  #3 right lumbosacral and hip area pain from strain lifting; no evidence of neuromuscular deficit on exam Plan: see Orders

## 2011-05-08 NOTE — Patient Instructions (Addendum)
Preventive Health Care: Exercise at least 30-45 minutes a day,  3-4 days a week.  Eat a low-fat diet with lots of fruits and vegetables, up to 7-9 servings per day. Consume less than 40 grams of sugar per day from foods & drinks with High Fructose Corn Sugar as # 1,2,3 or # 4 on label. Eye Doctor - have an eye exam @ least annually.                                                         Health Care Power of Attorney & Living Will. Complete if not in place ; these place you in charge of your health care decisions. Consider  [facility]'s smoking cessation program @ www.[employer].com or 680-241-5125 . Review the risks we discussed. A second option is  1-800-QUIT-NOW 2894197343) for free smoking cessation counseling. Cialis 20 mg as samples ; 1 every 3 days as needed The best exercises for the low back include freestyle swimming, stretch aerobics, and yoga. Please call if you change your mind about physical therapy. You should see Dr. Ethelene Hal if the back symptoms don't resolve with these interventions.

## 2011-05-09 ENCOUNTER — Ambulatory Visit: Payer: 59

## 2011-05-09 DIAGNOSIS — E119 Type 2 diabetes mellitus without complications: Secondary | ICD-10-CM

## 2011-05-09 LAB — CBC WITH DIFFERENTIAL/PLATELET
Basophils Relative: 1.3 % (ref 0.0–3.0)
Eosinophils Relative: 4.9 % (ref 0.0–5.0)
HCT: 39.9 % (ref 39.0–52.0)
Hemoglobin: 13 g/dL (ref 13.0–17.0)
Lymphs Abs: 2.3 10*3/uL (ref 0.7–4.0)
MCV: 87.9 fl (ref 78.0–100.0)
Monocytes Absolute: 0.3 10*3/uL (ref 0.1–1.0)
RBC: 4.53 Mil/uL (ref 4.22–5.81)
WBC: 3.1 10*3/uL — ABNORMAL LOW (ref 4.5–10.5)

## 2011-05-09 LAB — LIPID PANEL
Total CHOL/HDL Ratio: 4
Triglycerides: 47 mg/dL (ref 0.0–149.0)

## 2011-05-09 LAB — BASIC METABOLIC PANEL
Chloride: 106 mEq/L (ref 96–112)
Potassium: 4.8 mEq/L (ref 3.5–5.1)

## 2011-05-09 LAB — HEPATIC FUNCTION PANEL
Albumin: 4.3 g/dL (ref 3.5–5.2)
Alkaline Phosphatase: 58 U/L (ref 39–117)
Bilirubin, Direct: 0.1 mg/dL (ref 0.0–0.3)

## 2011-05-09 LAB — HEMOGLOBIN A1C: Mean Plasma Glucose: 120 mg/dL — ABNORMAL HIGH (ref <117)

## 2011-05-09 LAB — LDL CHOLESTEROL, DIRECT: Direct LDL: 137.2 mg/dL

## 2011-05-09 LAB — TSH: TSH: 0.87 u[IU]/mL (ref 0.35–5.50)

## 2011-06-06 ENCOUNTER — Other Ambulatory Visit (INDEPENDENT_AMBULATORY_CARE_PROVIDER_SITE_OTHER): Payer: 59

## 2011-06-06 DIAGNOSIS — D72820 Lymphocytosis (symptomatic): Secondary | ICD-10-CM

## 2011-06-06 LAB — CBC WITH DIFFERENTIAL/PLATELET
Basophils Absolute: 0 10*3/uL (ref 0.0–0.1)
Hemoglobin: 13.2 g/dL (ref 13.0–17.0)
Lymphocytes Relative: 33.8 % (ref 12.0–46.0)
Monocytes Relative: 6.5 % (ref 3.0–12.0)
Neutro Abs: 3.6 10*3/uL (ref 1.4–7.7)
Neutrophils Relative %: 53.7 % (ref 43.0–77.0)
RDW: 14.5 % (ref 11.5–14.6)

## 2011-06-06 NOTE — Progress Notes (Signed)
LABS ONLY  

## 2011-07-16 ENCOUNTER — Other Ambulatory Visit (HOSPITAL_COMMUNITY): Payer: Self-pay | Admitting: Urology

## 2011-07-16 DIAGNOSIS — N135 Crossing vessel and stricture of ureter without hydronephrosis: Secondary | ICD-10-CM

## 2011-07-28 ENCOUNTER — Other Ambulatory Visit: Payer: Self-pay

## 2011-07-28 MED ORDER — TADALAFIL 20 MG PO TABS: ORAL_TABLET | ORAL | Status: DC

## 2011-07-28 NOTE — Telephone Encounter (Signed)
RX sent

## 2011-07-28 NOTE — Telephone Encounter (Signed)
Call from patient and he stated he got a sample of Cialis and it worked okay for him and he wanted to get a refill sent to the [facility] outpatient pharmacy. Please advise     KP

## 2011-07-28 NOTE — Telephone Encounter (Signed)
Cialis 20 mg one half to one every 3 days as needed dispense 6

## 2011-07-28 NOTE — Telephone Encounter (Signed)
Dr.Hopper please advise, Cialis not listed on patient's medication list

## 2011-08-12 ENCOUNTER — Encounter (HOSPITAL_COMMUNITY): Payer: Self-pay

## 2011-08-12 ENCOUNTER — Encounter (HOSPITAL_COMMUNITY)
Admission: RE | Admit: 2011-08-12 | Discharge: 2011-08-12 | Disposition: A | Discharge status: Home / Self Care | Payer: 59 | Source: Ambulatory Visit | Attending: Urology | Admitting: Urology

## 2011-08-12 DIAGNOSIS — N1330 Unspecified hydronephrosis: Secondary | ICD-10-CM | POA: Insufficient documentation

## 2011-08-12 DIAGNOSIS — N135 Crossing vessel and stricture of ureter without hydronephrosis: Secondary | ICD-10-CM | POA: Insufficient documentation

## 2011-08-12 MED ORDER — TECHNETIUM TC 99M MERTIATIDE
15.0000 | Freq: Once | INTRAVENOUS | Status: AC | PRN
  Administered 2011-08-12: 15 via INTRAVENOUS

## 2011-08-12 MED ORDER — FUROSEMIDE 10 MG/ML IJ SOLN
40.0000 mg | Freq: Once | INTRAMUSCULAR | Status: DC
  Filled 2011-08-12: qty 4

## 2012-05-17 ENCOUNTER — Ambulatory Visit (INDEPENDENT_AMBULATORY_CARE_PROVIDER_SITE_OTHER): Payer: 59 | Admitting: Internal Medicine

## 2012-05-17 ENCOUNTER — Encounter: Payer: Self-pay | Admitting: Internal Medicine

## 2012-05-17 VITALS — BP 150/78 | HR 95 | Temp 98.2°F | Wt 164.4 lb

## 2012-05-17 DIAGNOSIS — M6740 Ganglion, unspecified site: Secondary | ICD-10-CM

## 2012-05-17 DIAGNOSIS — M6530 Trigger finger, unspecified finger: Secondary | ICD-10-CM

## 2012-05-17 DIAGNOSIS — M65311 Trigger thumb, right thumb: Secondary | ICD-10-CM

## 2012-05-17 NOTE — Progress Notes (Signed)
  Subjective:    Patient ID: Samuel Gross, male    DOB: 08-Aug-1950, 61 y.o.   MRN: [id number]  HPI Extremity pain Location:R thumb Onset:2 weeks ago Trigger/injury: no Character:Some  joint stiffness & swelling with triggering @ DIP Pain quality:throbbing Pain severity:up to 7 Duration:all day Radiation:no Exacerbating factors:gripping Treatment/response: NSAIDS & topical creams with minimal benefit   Review of Systems Constitutional: no fever, chills, sweats, change in weight  Musculoskeletal:no  muscle cramps or pain.  Skin:no rash, color/temp  change Neuro: no weakness; incontinence (stool/urine); numbness and tingling Heme:no lymphadenopathy; abnormal bruising or bleeding         Objective:   Physical Exam  He appears healthy and well-nourished in no distress  There is no lymphadenopathy about the axilla or epitrochlear areas  Radial artery pulses are normal  There is no change in temperature or color of the skin.  There is triggering and decreased range of motion of the DIP joint of the right thumb. There appears to be a possible ganglion over the radial aspect of the DIP thumb joint. This is tender to palpation  Otherwise the fingers reveal no significant joint changes        Assessment & Plan:  #1 pain @ right thumb at the DIP joint with triggering and possible ganglion formation. Suboptimal response to topical anti-inflammatory agents and nonsteroidals.  Plan: See recommendations

## 2012-05-17 NOTE — Patient Instructions (Addendum)
Review and correct the record as indicated. Please share record with all medical staff seen.  

## 2012-07-28 ENCOUNTER — Encounter: Payer: 59 | Admitting: Internal Medicine

## 2012-08-11 ENCOUNTER — Emergency Department (HOSPITAL_COMMUNITY)
Admission: EM | Admit: 2012-08-11 | Discharge: 2012-08-11 | Disposition: A | Discharge status: Home / Self Care | Payer: 59 | Source: Home / Self Care | Attending: Emergency Medicine | Admitting: Emergency Medicine

## 2012-08-11 ENCOUNTER — Encounter (HOSPITAL_COMMUNITY): Payer: Self-pay | Admitting: *Deleted

## 2012-08-11 DIAGNOSIS — S335XXA Sprain of ligaments of lumbar spine, initial encounter: Secondary | ICD-10-CM

## 2012-08-11 HISTORY — DX: Unspecified injury of lower back, initial encounter: S39.92XA

## 2012-08-11 MED ORDER — METHOCARBAMOL 500 MG PO TABS: 500.0000 mg | ORAL_TABLET | Freq: Three times a day (TID) | ORAL | Status: DC

## 2012-08-11 MED ORDER — MELOXICAM 15 MG PO TABS: 15.0000 mg | ORAL_TABLET | Freq: Every day | ORAL | Status: DC

## 2012-08-11 MED ORDER — TRAMADOL HCL 50 MG PO TABS: 100.0000 mg | ORAL_TABLET | Freq: Three times a day (TID) | ORAL | Status: DC | PRN

## 2012-08-11 NOTE — ED Provider Notes (Signed)
Chief Complaint  Patient presents with  . 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 This past Monday, 3 days ago, while at work he was transporting a patient in a wheelchair when he felt a sudden twinge in his right lower back. This was rated 8/10 in intensity. He injured his back in the same area about 3-4 years ago. The pain is localized to the right lower back, right hip, and radiates down as far as the right knee. It's worse if he stands or walks and better if he rests. There is no numbness, tingling or weakness. No bladder or bowel symptoms, saddle anesthesia, fever, chills, or unintended weight loss. He did not report this to his supervisor, did not fill out a safety zone portal, and has not filed workers compensation.  Review of Systems:  Other than noted above, the patient denies any of the following symptoms: Systemic:  No fever, chills, severe fatigue, or unexplained weight loss. GI:  No abdominal pain, nausea, vomiting, diarrhea, constipation, incontinence of bowel, or blood in stool. GU:  No dysuria, frequency, urgency, or hematuria. No incontinence of urine or difficulty urinating.  M-S:  No neck pain, joint pain, arthritis, or myalgias. Neuro:  No paresthesias, saddle anesthesia, muscular weakness, or progressive neurological deficit.  PMFSH:  Past medical history, family history, social history, meds, and allergies were reviewed. Specifically, there is no history of cancer, major trauma, osteoporosis, immunosuppression, HIV, or IV or injection drug use.   Physical Exam:   Vital signs:  BP 146/86  Pulse 86  Temp(Src) 99 F (37.2 C) (Oral)  SpO2 96% General:  Alert, oriented, in no distress. Abdomen:  Soft, non-tender.  No organomegaly or mass.  No pulsatile midline abdominal mass or bruit. Back:  There is pain to palpation in the right paravertebral muscle area but not at the midline. His  back has 85 of flexion, 10 of extension, 20 of lateral bending, and 45 of rotation with pain. Straight leg raising was positive on the right and negative on the left. Neuro:  Normal muscle strength, sensations and DTRs. Extremities: Pedal pulses were full, there was no edema. Skin:  Clear, warm and dry.  No rash.  Assessment:  The encounter diagnosis was Lumbar strain, initial encounter.  Plan:   1.  The following meds were prescribed:   Discharge Medication List as of 08/11/2012  5:58 PM    START taking these medications   Details  meloxicam (MOBIC) 15 MG tablet Take 1 tablet (15 mg total) by mouth daily., Starting 08/11/2012, Until Discontinued, Normal    methocarbamol (ROBAXIN) 500 MG tablet Take 1 tablet (500 mg total) by mouth 3 (three) times daily., Starting 08/11/2012, Until Discontinued, Normal    traMADol (ULTRAM) 50 MG tablet Take 2 tablets (100 mg total) by mouth every 8 (eight) hours as needed for pain., Starting 08/11/2012, Until Discontinued, Normal       2.  The patient was instructed in symptomatic care and handouts were given. 3.  The patient was told to return if becoming worse in any way, if no better in 2 weeks, and given some red flag symptoms that would indicate earlier return. 4.  The patient was encouraged to try to be as active as possible and given some exercises to do followed by moist heat.  Follow up:  The patient was told to follow up with Dr. Dion Saucier  if no improvement in 2 weeks.     Reuben Likes, MD 08/11/12 [phone]

## 2012-08-11 NOTE — ED Notes (Signed)
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  He felt a twinge but it got worse the next day.  He did not report it to his supervisor or do a safety portal zone.  He felt a pull in R lower back, R hip and R knee.  Hx. Injury 5 yrs ago and was out of work for 12 weeks.  Pain is on the same side.

## 2012-09-30 ENCOUNTER — Other Ambulatory Visit: Payer: Self-pay | Admitting: Internal Medicine

## 2012-10-18 ENCOUNTER — Other Ambulatory Visit (HOSPITAL_COMMUNITY): Payer: Self-pay | Admitting: Urology

## 2012-11-25 ENCOUNTER — Encounter (HOSPITAL_COMMUNITY)
Admission: RE | Admit: 2012-11-25 | Discharge: 2012-11-25 | Disposition: A | Discharge status: Home / Self Care | Payer: 59 | Source: Ambulatory Visit | Attending: Urology | Admitting: Urology

## 2012-11-25 DIAGNOSIS — N1330 Unspecified hydronephrosis: Secondary | ICD-10-CM | POA: Insufficient documentation

## 2012-11-25 MED ORDER — TECHNETIUM TC 99M MERTIATIDE
16.2000 | Freq: Once | INTRAVENOUS | Status: AC | PRN
  Administered 2012-11-25: 16 via INTRAVENOUS

## 2012-11-25 MED ORDER — FUROSEMIDE 10 MG/ML IJ SOLN
35.0000 mg | Freq: Once | INTRAMUSCULAR | Status: DC
  Filled 2012-11-25: qty 4

## 2012-12-09 ENCOUNTER — Telehealth: Payer: Self-pay | Admitting: *Deleted

## 2012-12-09 MED ORDER — TADALAFIL 20 MG PO TABS: ORAL_TABLET | ORAL | Status: DC

## 2012-12-09 NOTE — Telephone Encounter (Signed)
Refill done.  

## 2013-01-31 ENCOUNTER — Other Ambulatory Visit: Payer: Self-pay | Admitting: *Deleted

## 2013-01-31 MED ORDER — TADALAFIL 20 MG PO TABS: ORAL_TABLET | ORAL | Status: DC

## 2013-01-31 NOTE — Telephone Encounter (Signed)
Rx was refilled for Cialis.  Ag cma

## 2013-04-25 ENCOUNTER — Other Ambulatory Visit: Payer: Self-pay | Admitting: Internal Medicine

## 2013-04-25 NOTE — Telephone Encounter (Signed)
Cialis refilled per protocol 

## 2013-07-01 ENCOUNTER — Encounter (HOSPITAL_COMMUNITY): Payer: Self-pay | Admitting: Emergency Medicine

## 2013-07-01 ENCOUNTER — Emergency Department (INDEPENDENT_AMBULATORY_CARE_PROVIDER_SITE_OTHER): Payer: 59

## 2013-07-01 ENCOUNTER — Emergency Department (HOSPITAL_COMMUNITY)
Admission: EM | Admit: 2013-07-01 | Discharge: 2013-07-01 | Disposition: A | Discharge status: Home / Self Care | Payer: 59 | Source: Home / Self Care | Attending: Family Medicine | Admitting: Family Medicine

## 2013-07-01 DIAGNOSIS — J40 Bronchitis, not specified as acute or chronic: Secondary | ICD-10-CM

## 2013-07-01 DIAGNOSIS — R9389 Abnormal findings on diagnostic imaging of other specified body structures: Secondary | ICD-10-CM

## 2013-07-01 DIAGNOSIS — R918 Other nonspecific abnormal finding of lung field: Secondary | ICD-10-CM

## 2013-07-01 LAB — POCT RAPID STREP A: Streptococcus, Group A Screen (Direct): NEGATIVE

## 2013-07-01 MED ORDER — AZITHROMYCIN 250 MG PO TABS: 250.0000 mg | ORAL_TABLET | Freq: Every day | ORAL | Status: DC

## 2013-07-01 NOTE — ED Provider Notes (Signed)
CSN: [id number]     Arrival date & time 07/01/13  1507 History   None    Chief Complaint  Patient presents with  . Sore Throat   (Consider location/radiation/quality/duration/timing/severity/associated sxs/prior Treatment) Patient is a 63 y.o. male presenting with pharyngitis and cough. The history is provided by the patient. No language interpreter was used.  Sore Throat This is a new problem. The current episode started 2 days ago. The problem occurs constantly. The problem has been gradually worsening. Nothing aggravates the symptoms. Nothing relieves the symptoms. He has tried nothing for the symptoms.  Cough Cough characteristics:  Productive Sputum characteristics:  Nondescript Severity:  Moderate Onset quality:  Gradual Timing:  Constant Progression:  Worsening Chronicity:  New Smoker: yes   Relieved by:  Nothing Worsened by:  Nothing tried Ineffective treatments:  None tried   Past Medical History  Diagnosis Date  . UPJ obstruction, acquired 06/2010    Dr Karsten Ro  . RAD (reactive airway disease)   . Renal cyst   . Back injury 2011    tore muscles ligaments and tendons   Past Surgical History  Procedure Laterality Date  . Wisdom tooth extraction    . Upj correction  2012    Dr Karsten Ro  . Colonoscopy  2010    negative    Family History  Problem Relation Age of Onset  . Diabetes Mother   . Hypertension Father   . Hypertension Sister     X 2  . Hypertension Brother     X 3  . Heart attack Mother     in 23s  . Heart attack Father     in 38s   History  Substance Use Topics  . Smoking status: Current Every Day Smoker -- 0.50 packs/day    Types: Cigarettes  . Smokeless tobacco: Not on file  . Alcohol Use: Yes     Comment:  socially on weekends; 1/2 fifth    Review of Systems  Respiratory: Positive for cough.   All other systems reviewed and are negative.    Allergies  Review of patient's allergies indicates no known allergies.  Home Medications    Current Outpatient Rx  Name  Route  Sig  Dispense  Refill  . CIALIS 20 MG tablet      TAKE 1/2 TO 1 TABLET BY MOUTH EVERY 3 DAYS AS NEEDED   6 tablet   11     Dispense as written.   . cyclobenzaprine (FLEXERIL) 10 MG tablet   Oral   Take 10 mg by mouth 3 (three) times daily as needed.           Marland Kitchen ibuprofen (ADVIL,MOTRIN) 800 MG tablet   Oral   Take 800 mg by mouth every 8 (eight) hours as needed.           . meloxicam (MOBIC) 15 MG tablet   Oral   Take 1 tablet (15 mg total) by mouth daily.   15 tablet   0   . methocarbamol (ROBAXIN) 500 MG tablet   Oral   Take 1 tablet (500 mg total) by mouth 3 (three) times daily.   30 tablet   0   . traMADol (ULTRAM) 50 MG tablet   Oral   Take 2 tablets (100 mg total) by mouth every 8 (eight) hours as needed for pain.   30 tablet   0    BP 157/82  Pulse 98  Temp(Src) 97.8 F (36.6 C) (Oral)  Resp 16  SpO2 98% Physical Exam  Nursing note and vitals reviewed. Constitutional: He appears well-developed and well-nourished.  HENT:  Head: Normocephalic and atraumatic.  Right Ear: External ear normal.  Left Ear: External ear normal.  Nose: Nose normal.  Mouth/Throat: Oropharynx is clear and moist.  Eyes: Conjunctivae are normal. Pupils are equal, round, and reactive to light.  Cardiovascular: Normal rate and normal heart sounds.   Pulmonary/Chest: Effort normal and breath sounds normal.  Abdominal: Soft. Bowel sounds are normal.  Musculoskeletal: Normal range of motion.  Neurological: He is alert.  Skin: Skin is warm.  Psychiatric: He has a normal mood and affect.    ED Course  Procedures (including critical care time) Labs Review Labs Reviewed  POCT RAPID STREP A ([facility]RG CARE ONLY)   Imaging Review No results found.  EKG Interpretation    Date/Time:    Ventricular Rate:    PR Interval:    QRS Duration:   QT Interval:    QTC Calculation:   R Axis:     Text Interpretation:              MDM    1. Bronchitis   2. Abnormal CXR (chest x-ray)    Pt advised of need for follow up chest ct.   Pt advised to see Dr. Linna Darner for evaluation.   Pt given rx for zithromax.    Hopkins, PA-C 07/01/13 1805

## 2013-07-01 NOTE — ED Notes (Signed)
C/o sore throat, cough x past 2-3 days

## 2013-07-01 NOTE — Discharge Instructions (Signed)
Bronchitis Bronchitis is inflammation of the airways that extend from the windpipe into the lungs (bronchi). The inflammation often causes mucus to develop, which leads to a cough. If the inflammation becomes severe, it may cause shortness of breath. CAUSES  Bronchitis may be caused by:   Viral infections.   Bacteria.   Cigarette smoke.   Allergens, pollutants, and other irritants.  SIGNS AND SYMPTOMS  The most common symptom of bronchitis is a frequent cough that produces mucus. Other symptoms include:  Fever.   Body aches.   Chest congestion.   Chills.   Shortness of breath.   Sore throat.  DIAGNOSIS  Bronchitis is usually diagnosed through a medical history and physical exam. Tests, such as chest X-rays, are sometimes done to rule out other conditions.  TREATMENT  You may need to avoid contact with whatever caused the problem (smoking, for example). Medicines are sometimes needed. These may include:  Antibiotics. These may be prescribed if the condition is caused by bacteria.  Cough suppressants. These may be prescribed for relief of cough symptoms.   Inhaled medicines. These may be prescribed to help open your airways and make it easier for you to breathe.   Steroid medicines. These may be prescribed for those with recurrent (chronic) bronchitis. HOME CARE INSTRUCTIONS  Get plenty of rest.   Drink enough fluids to keep your urine clear or pale yellow (unless you have a medical condition that requires fluid restriction). Increasing fluids may help thin your secretions and will prevent dehydration.   Only take over-the-counter or prescription medicines as directed by your health care provider.  Only take antibiotics as directed. Make sure you finish them even if you start to feel better.  Avoid secondhand smoke, irritating chemicals, and strong fumes. These will make bronchitis worse. If you are a smoker, quit smoking. Consider using nicotine gum or  skin patches to help control withdrawal symptoms. Quitting smoking will help your lungs heal faster.   Put a cool-mist humidifier in your bedroom at night to moisten the air. This may help loosen mucus. Change the water in the humidifier daily. You can also run the hot water in your shower and sit in the bathroom with the door closed for 5 10 minutes.   Follow up with your health care provider as directed.   Wash your hands frequently to avoid catching bronchitis again or spreading an infection to others.  SEEK MEDICAL CARE IF: Your symptoms do not improve after 1 week of treatment.  SEEK IMMEDIATE MEDICAL CARE IF:  Your fever increases.  You have chills.   You have chest pain.   You have worsening shortness of breath.   You have bloody sputum.  You faint.  You have lightheadedness.  You have a severe headache.   You vomit repeatedly. MAKE SURE YOU:   Understand these instructions.  Will watch your condition.  Will get help right away if you are not doing well or get worse. Document Released: 05/26/2005 Document Revised: 03/16/2013 Document Reviewed: 01/18/2013 [facility] Patient Information 2014 [place]. Incidental Abnormal Radiological Finding An incidental abnormal radiologic finding is a very small mass or scar tissue, detected anywhere in the body, unrelated to the reason for your visit. With newer imaging tests and technologies, it is becoming more common to detect small masses and tissue abnormalities. It is important for you to work with your caregiver because it can sometimes be related to undiagnosed illness or other symptoms. Most often however, the finding is not causing  symptoms and is not a cause for concern. TYPES OF FINDINGS Abnormal radiologic findings are often located in the kidneys or lungs, but they can also be found in the heart, liver, breasts, brain, gallbladder, uterus and other surrounding organs and tissues. There are many types of  masses and tissue abnormalities that can be detected during an imaging test. These may include:  Lesions  changes in tissue due to infection, tissue death, or trauma.  Cysts a sac filled with fluid, crystals, or some other substance.  Tumors non-cancerous or cancerous solid formation. You may hear medical terms, such as, "pulmonary nodule" (a small mass in the lung) or "renal mass" (mass in the kidney). Ask your caregiver if these terms apply to your findings.  DO I NEED FURTHER DIAGNOSIS? There are many possible causes of incidental radiologic findings. Your caregiver will determine whether it requires additional screening tests, diagnostic tests, treatments, or referral to a surgeon.Generally, very small tissue changes or masses will not require any follow up testing. Much research has been done in this area.These very small abnormalities are considered low risk of becoming a problem in the future.  Depending on the size and appearance of the finding, your caregiver may recommend additional testing.Additional testing may also be recommended if you have certain risk factors or medical conditions that increase your risk of related problems. It is a good idea to have additional testing if you have other symptoms or concerns. Sometimes, these early findings can give you a chance at early treatment and avoid problems in the future. TESTING AND DIAGNOSIS Tests and exams may be a one-time screening or periodic follow-up. Periodic follow-up will help your caregiver determine whether the abnormality is growing and becoming a concern. Tests may include:  Physical examination.  Blood tests.  Urine tests.  Imaging tests, such as abdominal ultrasound, CT scan, or MRI.  Biopsy. TREATMENT Treatment varies, depending on the cause, location, size and appearance of the finding. Treatment will also depend on your age and underlying conditions or symptoms. Sometimes treatment is not necessary at all.  However, treatment may include:  Watchful waiting with periodic examination and testing.  Treatments to reduce the size of the abnormality.  Surgical or biopsy removal of the abnormality.  Additional treatments to address any underlying conditions. HOME CARE INSTRUCTIONS   See your caregiver for follow up examination and testing as directed. It is important that you schedule appointments as directed.  Keep calm. Your caregiver will let you know if this is a routine follow up, or if there is a reason for concern. Remember, early detection can be very beneficial to you.  Follow all of your caregiver's aftercare instructions related to the reason for your visit. Document Released: 09/10/2010 Document Revised: 08/18/2011 Document Reviewed: 09/10/2010 [facility] Patient Information 2014 [place].

## 2013-07-02 NOTE — ED Provider Notes (Signed)
Medical screening examination/treatment/procedure(s) were performed by a resident physician or non-physician practitioner and as the supervising physician I was immediately available for consultation/collaboration.  Lynne Leader, MD    Gregor Hams, MD 07/02/13 [phone]

## 2013-07-03 LAB — CULTURE, GROUP A STREP

## 2013-10-12 ENCOUNTER — Other Ambulatory Visit (HOSPITAL_COMMUNITY): Payer: Self-pay | Admitting: Urology

## 2013-10-12 DIAGNOSIS — Q6211 Congenital occlusion of ureteropelvic junction: Principal | ICD-10-CM

## 2013-10-12 DIAGNOSIS — Q6239 Other obstructive defects of renal pelvis and ureter: Secondary | ICD-10-CM

## 2013-12-01 ENCOUNTER — Ambulatory Visit (HOSPITAL_COMMUNITY)
Admission: RE | Admit: 2013-12-01 | Discharge: 2013-12-01 | Disposition: A | Discharge status: Home / Self Care | Payer: 59 | Source: Ambulatory Visit | Attending: Urology | Admitting: Urology

## 2013-12-01 ENCOUNTER — Encounter (HOSPITAL_COMMUNITY): Payer: Self-pay

## 2013-12-01 DIAGNOSIS — N1330 Unspecified hydronephrosis: Secondary | ICD-10-CM | POA: Insufficient documentation

## 2013-12-01 DIAGNOSIS — Q6211 Congenital occlusion of ureteropelvic junction: Secondary | ICD-10-CM

## 2013-12-01 DIAGNOSIS — Q6239 Other obstructive defects of renal pelvis and ureter: Secondary | ICD-10-CM

## 2013-12-01 MED ORDER — TECHNETIUM TC 99M MERTIATIDE
15.4000 | Freq: Once | INTRAVENOUS | Status: AC | PRN
  Administered 2013-12-01: 15.4 via INTRAVENOUS

## 2013-12-01 MED ORDER — FUROSEMIDE 10 MG/ML IJ SOLN
40.0000 mg | Freq: Once | INTRAMUSCULAR | Status: AC
  Administered 2013-12-01: 37 mg via INTRAVENOUS
  Filled 2013-12-01: qty 4

## 2014-05-26 ENCOUNTER — Other Ambulatory Visit: Payer: Self-pay | Admitting: Internal Medicine

## 2014-05-26 NOTE — Telephone Encounter (Signed)
Sorry ; last seen 12/13. He would need to be seen before refills

## 2015-02-01 ENCOUNTER — Other Ambulatory Visit: Payer: Self-pay | Admitting: Family Medicine

## 2015-02-01 ENCOUNTER — Encounter: Payer: Self-pay | Admitting: Family Medicine

## 2015-02-01 ENCOUNTER — Ambulatory Visit (INDEPENDENT_AMBULATORY_CARE_PROVIDER_SITE_OTHER): Payer: 59 | Admitting: Family Medicine

## 2015-02-01 ENCOUNTER — Ambulatory Visit
Admission: RE | Admit: 2015-02-01 | Discharge: 2015-02-01 | Disposition: A | Discharge status: Home / Self Care | Payer: 59 | Source: Ambulatory Visit | Attending: Family Medicine | Admitting: Family Medicine

## 2015-02-01 VITALS — BP 132/88 | HR 81 | Temp 98.6°F | Ht 69.0 in | Wt 163.3 lb

## 2015-02-01 DIAGNOSIS — Z114 Encounter for screening for human immunodeficiency virus [HIV]: Secondary | ICD-10-CM

## 2015-02-01 DIAGNOSIS — Z609 Problem related to social environment, unspecified: Secondary | ICD-10-CM

## 2015-02-01 DIAGNOSIS — Z23 Encounter for immunization: Secondary | ICD-10-CM

## 2015-02-01 DIAGNOSIS — Z720 Tobacco use: Secondary | ICD-10-CM

## 2015-02-01 DIAGNOSIS — M1711 Unilateral primary osteoarthritis, right knee: Secondary | ICD-10-CM

## 2015-02-01 DIAGNOSIS — B351 Tinea unguium: Secondary | ICD-10-CM

## 2015-02-01 DIAGNOSIS — Z79899 Other long term (current) drug therapy: Secondary | ICD-10-CM

## 2015-02-01 DIAGNOSIS — R739 Hyperglycemia, unspecified: Secondary | ICD-10-CM

## 2015-02-01 DIAGNOSIS — Z9189 Other specified personal risk factors, not elsewhere classified: Secondary | ICD-10-CM

## 2015-02-01 DIAGNOSIS — R911 Solitary pulmonary nodule: Secondary | ICD-10-CM

## 2015-02-01 DIAGNOSIS — E785 Hyperlipidemia, unspecified: Secondary | ICD-10-CM

## 2015-02-01 DIAGNOSIS — R030 Elevated blood-pressure reading, without diagnosis of hypertension: Secondary | ICD-10-CM | POA: Diagnosis not present

## 2015-02-01 DIAGNOSIS — Z7289 Other problems related to lifestyle: Secondary | ICD-10-CM | POA: Diagnosis not present

## 2015-02-01 DIAGNOSIS — R7303 Prediabetes: Secondary | ICD-10-CM

## 2015-02-01 DIAGNOSIS — R7309 Other abnormal glucose: Secondary | ICD-10-CM

## 2015-02-01 DIAGNOSIS — F17200 Nicotine dependence, unspecified, uncomplicated: Secondary | ICD-10-CM

## 2015-02-01 DIAGNOSIS — J439 Emphysema, unspecified: Secondary | ICD-10-CM

## 2015-02-01 DIAGNOSIS — IMO0001 Reserved for inherently not codable concepts without codable children: Secondary | ICD-10-CM

## 2015-02-01 LAB — CBC
HEMATOCRIT: 43.5 % (ref 39.0–52.0)
Hemoglobin: 14.7 g/dL (ref 13.0–17.0)
MCH: 28.7 pg (ref 26.0–34.0)
MCHC: 33.8 g/dL (ref 30.0–36.0)
MCV: 85 fL (ref 78.0–100.0)
MPV: 10.1 fL (ref 8.6–12.4)
Platelets: 199 10*3/uL (ref 150–400)
RBC: 5.12 MIL/uL (ref 4.22–5.81)
RDW: 13.9 % (ref 11.5–15.5)
WBC: 6.6 10*3/uL (ref 4.0–10.5)

## 2015-02-01 LAB — LIPID PANEL
CHOL/HDL RATIO: 4.3 ratio (ref <=5.0)
Cholesterol: 251 mg/dL — ABNORMAL HIGH (ref 125–200)
HDL: 59 mg/dL (ref >=40)
LDL Cholesterol: 178 mg/dL — ABNORMAL HIGH (ref <130)
Triglycerides: 71 mg/dL (ref <150)
VLDL: 14 mg/dL (ref <30)

## 2015-02-01 LAB — COMPREHENSIVE METABOLIC PANEL
ALBUMIN: 4.6 g/dL (ref 3.6–5.1)
ALT: 32 U/L (ref 9–46)
AST: 21 U/L (ref 10–35)
Alkaline Phosphatase: 60 U/L (ref 40–115)
BUN: 16 mg/dL (ref 7–25)
CALCIUM: 10 mg/dL (ref 8.6–10.3)
CHLORIDE: 104 mmol/L (ref 98–110)
CO2: 28 mmol/L (ref 20–31)
CREATININE: 1.16 mg/dL (ref 0.70–1.25)
Glucose, Bld: 101 mg/dL — ABNORMAL HIGH (ref 65–99)
Potassium: 5 mmol/L (ref 3.5–5.3)
SODIUM: 143 mmol/L (ref 135–146)
Total Bilirubin: 0.5 mg/dL (ref 0.2–1.2)
Total Protein: 7.8 g/dL (ref 6.1–8.1)

## 2015-02-01 LAB — HEPATITIS C ANTIBODY: HCV Ab: NEGATIVE

## 2015-02-01 LAB — POCT GLYCOSYLATED HEMOGLOBIN (HGB A1C): HEMOGLOBIN A1C: 5.9

## 2015-02-01 MED ORDER — ACETAMINOPHEN 500 MG PO TABS: 1000.0000 mg | ORAL_TABLET | Freq: Four times a day (QID) | ORAL | Status: AC | PRN

## 2015-02-01 NOTE — Patient Instructions (Addendum)
Please go for a chest Xray to follow up a spot seen on your last chest Xray in January 2015.    You are starting to develop Emphysema of the lungs from smoking tobacco.  If you continue smoking the condition will worsen over time.  If you stop smoking the emphysema should not get worse over time.  If you would like assistance with stopping smoking, let Dr Marshella Tello's office know.  They will set you up an appointment to talk with Dr Valentina Lucks, our smoking cessation specialist, at the [facility] to discuss how we can help you stop smoking.    Call the 1-800- QUIT NOW to get counseling and assistance with stopping smoking if you decide to quit smoking.   Dr Taylyn Brame will call you if your tests are not good. Otherwise he will send you a letter.  If you sign up for MyChart online, you will be able to see your test results once Dr Zia Kanner has reviewed them.  If you do not hear from [place] with in 2 weeks please call our office.  You received a pneumonia vaccination today because you are at increased risk of pneumonia from your smoking./ You also received a Tetanus booster today.   You may take Tylenol or Advil for your knee pain from osteoarthritis.  If the pain worsens, consider having a steroid knee injection to treat the pain.   Return for Nursing visits three times to have your blood pressure rechecked to see if you have gone from [place] high blood pressure to true Hypertension that needs treatment.   To check for increased pressure in your eyes, called Glaucoma, go for eye doctor examine every 2 years.  African Americans are at increased risk of Glaucoma.  It is a leading cause of blindness in the African- American community./  Take the Prescription for Zostavax vaccination to your pharmacy.  They will administer the vaccination and file the cost with your insurance.   Please read the patient education material about the Shingles vaccination called Zostavax.   Herpes Zoster Virus  Vaccine What is this medicine? HERPES ZOSTER VIRUS VACCINE (HUR peez ZOS ter vahy ruhs vak SEEN) is a vaccine. It is used to prevent shingles in adults 64 years old and over. This vaccine is not used to treat shingles or nerve pain from shingles. This medicine may be used for other purposes; ask your health care provider or pharmacist if you have questions. COMMON BRAND NAME(S): Varivax, Zostavax What should I tell my health care provider before I take this medicine? They need to know if you have any of these conditions: -cancer like leukemia or lymphoma -immune system problems or therapy -infection with fever -tuberculosis -an unusual or allergic reaction to vaccines, neomycin, gelatin, other medicines, foods, dyes, or preservatives -pregnant or trying to get pregnant -breast-feeding How should I use this medicine? This vaccine is for injection under the skin. It is given by a health care professional. Talk to your pediatrician regarding the use of this medicine in children. This medicine is not approved for use in children. Overdosage: If you think you have taken too much of this medicine contact a poison control center or emergency room at once. NOTE: This medicine is only for you. Do not share this medicine with others. What if I miss a dose? This does not apply. What may interact with this medicine? Do not take this medicine with any of the following medications: -adalimumab -anakinra -etanercept -infliximab -medicines to treat cancer -  medicines that suppress your immune system This medicine may also interact with the following medications: -immunoglobulins -steroid medicines like prednisone or cortisone This list may not describe all possible interactions. Give your health care provider a list of all the medicines, herbs, non-prescription drugs, or dietary supplements you use. Also tell them if you smoke, drink alcohol, or use illegal drugs. Some items may interact with your  medicine. What should I watch for while using this medicine? Visit your doctor for regular check ups. This vaccine, like all vaccines, may not fully protect everyone. After receiving this vaccine it may be possible to pass chickenpox infection to others. Avoid people with immune system problems, pregnant women who have not had chickenpox, and newborns of women who have not had chickenpox. Talk to your doctor for more information. What side effects may I notice from receiving this medicine? Side effects that you should report to your doctor or health care professional as soon as possible: -allergic reactions like skin rash, itching or hives, swelling of the face, lips, or tongue -breathing problems -feeling faint or lightheaded, falls -fever, flu-like symptoms -pain, tingling, numbness in the hands or feet -swelling of the ankles, feet, hands -unusually weak or tired Side effects that usually do not require medical attention (report to your doctor or health care professional if they continue or are bothersome): -aches or pains -chickenpox-like rash -diarrhea -headache -loss of appetite -nausea, vomiting -redness, pain, swelling at site where injected -runny nose This list may not describe all possible side effects. Call your doctor for medical advice about side effects. You may report side effects to FDA at 1-800-FDA-1088.   Osteoarthritis Osteoarthritis is a disease that causes soreness and inflammation of a joint. It occurs when the cartilage at the affected joint wears down. Cartilage acts as a cushion, covering the ends of bones where they meet to form a joint. Osteoarthritis is the most common form of arthritis. It often occurs in older people. The joints affected most often by this condition include those in the:  Ends of the fingers.  Thumbs.  Neck.  Lower back.  Knees.  Hips. CAUSES  Over time, the cartilage that covers the ends of bones begins to wear away. This  causes bone to rub on bone, producing pain and stiffness in the affected joints.  RISK FACTORS Certain factors can increase your chances of having osteoarthritis, including:  Older age.  Excessive body weight.  Overuse of joints.  Previous joint injury. SIGNS AND SYMPTOMS   Pain, swelling, and stiffness in the joint.  Over time, the joint may lose its normal shape.  Small deposits of bone (osteophytes) may grow on the edges of the joint.  Bits of bone or cartilage can break off and float inside the joint space. This may cause more pain and damage. DIAGNOSIS  Your health care provider will do a physical exam and ask about your symptoms. Various tests may be ordered, such as:  X-rays of the affected joint.  An MRI scan.  Blood tests to rule out other types of arthritis.  Joint fluid tests. This involves using a needle to draw fluid from the joint and examining the fluid under a microscope. TREATMENT  Goals of treatment are to control pain and improve joint function. Treatment plans may include:  A prescribed exercise program that allows for rest and joint relief.  A weight control plan.  Pain relief techniques, such as:  Properly applied heat and cold.  Electric pulses delivered to nerve endings  under the skin (transcutaneous electrical nerve stimulation [TENS]).  Massage.  Certain nutritional supplements.  Medicines to control pain, such as:  Acetaminophen.  Nonsteroidal anti-inflammatory drugs (NSAIDs), such as naproxen.  Narcotic or central-acting agents, such as tramadol.  Corticosteroids. These can be given orally or as an injection.  Surgery to reposition the bones and relieve pain (osteotomy) or to remove loose pieces of bone and cartilage. Joint replacement may be needed in advanced states of osteoarthritis. HOME CARE INSTRUCTIONS   Take medicines only as directed by your health care provider.  Maintain a healthy weight. Follow your health care  provider's instructions for weight control. This may include dietary instructions.  Exercise as directed. Your health care provider can recommend specific types of exercise. These may include:  Strengthening exercises. These are done to strengthen the muscles that support joints affected by arthritis. They can be performed with weights or with exercise bands to add resistance.  Aerobic activities. These are exercises, such as brisk walking or low-impact aerobics, that get your heart pumping.  Range-of-motion activities. These keep your joints limber.  Balance and agility exercises. These help you maintain daily living skills.  Rest your affected joints as directed by your health care provider.  Keep all follow-up visits as directed by your health care provider. SEEK MEDICAL CARE IF:   Your skin turns red.  You develop a rash in addition to your joint pain.  You have worsening joint pain.  You have a fever along with joint or muscle aches. SEEK IMMEDIATE MEDICAL CARE IF:  You have a significant loss of weight or appetite.  You have night sweats. [place] of Arthritis and Musculoskeletal and Skin Diseases: www.niams.SouthExposed.es  [employer] on Aging: http://kim-miller.com/  American College of Rheumatology: www.rheumatology.org Document Released: 05/26/2005 Document Revised: 10/10/2013 Document Reviewed: 01/31/2013 [facility] Patient Information 2015 [place], [place]. This information is not intended to replace advice given to you by your health care provider. Make sure you discuss any questions you have with your health care provider.

## 2015-02-02 ENCOUNTER — Encounter: Payer: Self-pay | Admitting: Family Medicine

## 2015-02-02 ENCOUNTER — Telehealth: Payer: Self-pay | Admitting: Family Medicine

## 2015-02-02 DIAGNOSIS — M1711 Unilateral primary osteoarthritis, right knee: Secondary | ICD-10-CM

## 2015-02-02 DIAGNOSIS — R911 Solitary pulmonary nodule: Secondary | ICD-10-CM

## 2015-02-02 DIAGNOSIS — E785 Hyperlipidemia, unspecified: Secondary | ICD-10-CM

## 2015-02-02 DIAGNOSIS — IMO0001 Reserved for inherently not codable concepts without codable children: Secondary | ICD-10-CM

## 2015-02-02 DIAGNOSIS — Z9189 Other specified personal risk factors, not elsewhere classified: Secondary | ICD-10-CM

## 2015-02-02 DIAGNOSIS — R7303 Prediabetes: Secondary | ICD-10-CM

## 2015-02-02 DIAGNOSIS — R030 Elevated blood-pressure reading, without diagnosis of hypertension: Secondary | ICD-10-CM

## 2015-02-02 DIAGNOSIS — E7800 Pure hypercholesterolemia, unspecified: Secondary | ICD-10-CM | POA: Insufficient documentation

## 2015-02-02 DIAGNOSIS — B351 Tinea unguium: Secondary | ICD-10-CM

## 2015-02-02 HISTORY — DX: Hyperlipidemia, unspecified: E78.5

## 2015-02-02 HISTORY — DX: Tinea unguium: B35.1

## 2015-02-02 HISTORY — DX: Reserved for inherently not codable concepts without codable children: IMO0001

## 2015-02-02 HISTORY — DX: Prediabetes: R73.03

## 2015-02-02 HISTORY — DX: Unilateral primary osteoarthritis, right knee: M17.11

## 2015-02-02 HISTORY — DX: Solitary pulmonary nodule: R91.1

## 2015-02-02 LAB — HIV ANTIBODY (ROUTINE TESTING W REFLEX): HIV 1&2 Ab, 4th Generation: NONREACTIVE

## 2015-02-02 NOTE — Assessment & Plan Note (Signed)
Established problem.  New to our practive Hypertrophic boney enlargement on physical exam right knee FHx of knee and hip osteoarthritis requiring knee and hip replacements in first-degree relatives.  Discussed use of OTC analgesics.  Recommended prn use of APAP or Ibuprofen for now.   Discussed use of intra-articular corticosteroid injection / hyaluronic injections Assist devices, PT, and surgical consult for opinion on joint replacement.  Not interefering enough now that he wants to pursue any other therapies other than APAP and IBuprofen for now.

## 2015-02-02 NOTE — Telephone Encounter (Signed)
A1C 5.9 %  HIV and HCV serologies negative  Total Cholesterol 251   HDL 59    LDL 178  CXR (2V): Small nodular density projected over the anterior right fourth rib, less obvious on today's exam, possibly a bone lesion. No interim progression. No acute cardiopulmonary disease.  A/P 1. Prediabetes- Risk Factors for progression to DMT2 Age > 87, First degree relative, African-American, Dyslipidemia 2. Hyperlipidemia with Fr[place]ham 10 year risk CVE = 18%.  LDL Goal < 130 mg/dL.  3. Stable Nodule on CXR since 07/05/10 CXR.  Suspect intraosseous change of 4th rib.  Asymptomatic.    Recommendation Discuss Lifestyle changes including mediterreanean diet, frutis, nuts, dairy and formal exercise on ov next month. I do not think Samuel Gross will be able to achieve goal of < 130 mg/dL with lifestyle changes alone, so will discuss trial of statin therapy next ov Given stability of CXR nodule since 2012, no further monitoring needed unless pt becomes symptomatic.   These findings and recommendations were discussed with Samuel Gross by phone today.

## 2015-02-02 NOTE — Progress Notes (Signed)
Subjective:    Patient ID: Samuel Gross, male    DOB: 1951-02-03, 64 y.o.   MRN: [id number] Patient accompanied by his wife, Samuel Gross Sources of clinical visit information is from patient, wife, and EMR Patient lives with his wife. He is able to care for himself without assistance or supervision.  HPI   Patient visiting office today to establish primary medical care with our practice.  Right knee Pain Onset: Several years ago Location: inside of right knee Quality: aching, dull Severity: moderate Function: pain with walking towards end of day.  Pain increases with climbing stairs.  Samuel Gross is part of [employer] department at [employer].  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  Duration: years Pattern: stiff for about 10 minutes in morning and for couple minutes after prolonged standing. Pain throughout day.  Pain resolves as rests at home.  Course: slowly progressive Radiation: none Relief: Rest, Ibuprofen 200 mg daily Precipitant: no acute injury to knee in history. Longstanding stress on knees from prolonged standing and walking  Associated Symptoms: No knee swelling.  No pain in other joints.  Hx of trigger thumbs treated with corticosteroid injections Prior Diagnostic Testing or Treatments:  Relevant PMH/PSH: Left Hip Xray 2011 with mild DJD; Lumbar spine Xray 2009 with mild degenerative disc disease and mild facet osteoarthritis.  Sclerosis of right SI joint; MRI Lumbar Spine w/o contrast 04/2010 show mild lumbar spondylosis L3-4 and L4-5 with facet arthrosis. Mild bilateral foraminal narrowing at both levels.  No stenosis. History of injury about 7 years ago for which he was out of work for 12 weeks.       Right toenail discoloration Location: right great toe nail Onset: first noticed one to 2 months ago  Course: no change in appearance Self-treated with: topical OTC antifungal solution for nails             Improvement with  treatment: none No change in appearance of nail since noted that nail looked different that he expected.  Described cutting nail too close with subsequent change in nail appearance.  History Pruritis: no  Tenderness: no  New medications/antibiotics: no  Injury: Recall cutting thickened right great toenail around time he noted discoloration of nail  Red Flags Feeling ill: no  Fever: no  Claudication: no  Past Medical History  Diagnosis Date  . RAD (reactive airway disease)   . Renal cyst   . Back injury 2011    tore muscles ligaments and tendons  . [place] DISEASE, LUMBAR 12/28/2009    Qualifier: Diagnosis of  By: Samuel Darner MD, Samuel Gross    . UPJ (ureteropelvic junction) obstruction 05/08/2011     With Hydronephrosis ;S/P surgical correction 06/2010 , Dr Samuel Gross   . HYPERPLASIA PROSTATE UNS W/O UR OBST & OTH LUTS 12/28/2009    Qualifier: Diagnosis of  By: Samuel Darner MD, Samuel Gross 03/17/2009    Qualifier: Diagnosis of  By: Samuel Gross    . BACK PAIN, THORACIC REGION 02/12/2010    Qualifier: Diagnosis of  By: Samuel Darner MD, Samuel Gross    . Osteoarthritis of right knee 02/02/2015    Right medial knee osteoarthritis pain.   . Prediabetes 02/02/2015  . Hyperlipidemia 02/02/2015  . Onychomycosis 02/02/15    multiple toes on both feet   Past Surgical History  Procedure Laterality Date  . Wisdom tooth extraction    . Upj correction  2012    Dr Samuel Gross  .  Colonoscopy  2010    negative     reports that he has been smoking Cigarettes.  He has a 22.5 pack-year smoking history. He does not have any smokeless tobacco history on file. He reports that he drinks alcohol. He reports that he does not use illicit drugs. family history includes Diabetes in his mother; Gout in his brother; Heart attack in his father and mother; Hyperlipidemia in his brother, brother, sister, and sister; Hypertension in his brother, father, and sister; Kidney failure in his brother and sister; Osteoarthritis in his brother,  brother, sister, and sister; Rheum arthritis in his brother and sister; Sarcoidosis in his daughter and sister.   Review of Systems 11 system ROS with 45 individual items denied.     Objective:   Physical Exam  Constitutional: He appears well-developed and well-nourished. He does not appear ill. No distress.  HENT:  Head: Normocephalic.  Right Ear: Hearing and tympanic membrane normal.  Left Ear: Hearing and tympanic membrane normal.  Eyes: Conjunctivae, EOM and lids are normal. Pupils are equal, round, and reactive to light. Right eye exhibits no chemosis. Left eye exhibits no chemosis. No scleral icterus.  Neck: Normal range of motion. Neck supple. No hepatojugular reflux and no JVD present. Carotid bruit is not present. No thyroid mass and no thyromegaly present.  Cardiovascular: Normal rate, regular rhythm, normal heart sounds and normal pulses.  PMI is not displaced.  Exam reveals no gallop.   No murmur heard. Pulmonary/Chest: Effort normal and breath sounds normal. No accessory muscle usage. No respiratory distress.  Abdominal: Soft. Normal appearance and bowel sounds are normal. He exhibits no mass. There is no hepatosplenomegaly. There is no tenderness. There is no rebound.  Lymphadenopathy:    He has no cervical adenopathy.       Right: No inguinal adenopathy present.       Left: No inguinal adenopathy present.  Neurological: He is alert. Gait normal. GCS eye subscore is 4. GCS verbal subscore is 5. GCS motor subscore is 6.  Psychiatric: He has a normal mood and affect. His speech is normal and behavior is normal. Judgment and thought content normal. Cognition and memory are normal.          Assessment & Plan:

## 2015-02-02 NOTE — Assessment & Plan Note (Signed)
New Problem Review of old BP measures shows frequent elevations in Systolic BP No prior diagnosis of hypertension Strong FHx of HTN No clinical evidence of end organ damage. Basic Metabolic Panel:    Component Value Date/Time   NA 143 02/01/2015 1126   K 5.0 02/01/2015 1126   CL 104 02/01/2015 1126   CO2 28 02/01/2015 1126   BUN 16 02/01/2015 1126   CREATININE 1.16 02/01/2015 1126   CREATININE 1.2 05/08/2011 1512   GLUCOSE 101* 02/01/2015 1126   CALCIUM 10.0 02/01/2015 1126   Recommendation - Nurse Visits x 3 for BP measurements - RTC in one month to consider diagnosis of hypertension

## 2015-02-02 NOTE — Assessment & Plan Note (Signed)
New Problem ASCVD 10-yr risk = 21%.  Recommendation moderate to high intensity statin therapy Will discuss with patient on next office visit in month.

## 2015-02-02 NOTE — Assessment & Plan Note (Signed)
Established problem Patient in precontemplative phase of change. Discussed benefits of smoking cessation Offered assistance of practice and gave contact information for State run smoking cessation program.

## 2015-02-02 NOTE — Assessment & Plan Note (Signed)
New problem Asymptomatic Stable RUL opacity on CXR 8/265/16 compared to CXR in 2012. 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

## 2015-02-02 NOTE — Assessment & Plan Note (Signed)
New problem Discolored left great toenail could be a combination of resolving subungal hematoma, melanonychia and onycomycosis in patient with chronic nail trauma.  There appears to be clearing at base of nail, so low suspicion for melanoma.   Observe for now Discussed use of oral antifungal therapy.  Pt not interested at this time.  He will continue using the OTC antifungal nail solution.

## 2015-02-02 NOTE — Assessment & Plan Note (Signed)
New problem Patient is at risk for developing DMT2 Will discuss role of lifestyle changes on next ov Recommend nutritionist referral for MNT to delay or prevent progression to DMT2

## 2015-02-13 ENCOUNTER — Ambulatory Visit (INDEPENDENT_AMBULATORY_CARE_PROVIDER_SITE_OTHER): Payer: 59 | Admitting: *Deleted

## 2015-02-13 VITALS — BP 150/88 | HR 88

## 2015-02-13 DIAGNOSIS — IMO0001 Reserved for inherently not codable concepts without codable children: Secondary | ICD-10-CM

## 2015-02-13 DIAGNOSIS — Z0130 Encounter for examination of blood pressure without abnormal findings: Secondary | ICD-10-CM

## 2015-02-13 DIAGNOSIS — Z136 Encounter for screening for cardiovascular disorders: Secondary | ICD-10-CM

## 2015-02-13 DIAGNOSIS — R030 Elevated blood-pressure reading, without diagnosis of hypertension: Secondary | ICD-10-CM

## 2015-02-13 NOTE — Progress Notes (Signed)
   Pt in nurse clinic for blood pressure check.  BP 170/100 manually, heart rate 88.  BP recheck 150/88 .  Patient denies chest pain, SOB, dizziness, headache or n/v.  Will forward to PCP.  Derl Barrow, RN

## 2015-02-21 ENCOUNTER — Ambulatory Visit (INDEPENDENT_AMBULATORY_CARE_PROVIDER_SITE_OTHER): Payer: 59 | Admitting: *Deleted

## 2015-02-21 VITALS — BP 170/90 | HR 92

## 2015-02-21 DIAGNOSIS — IMO0001 Reserved for inherently not codable concepts without codable children: Secondary | ICD-10-CM

## 2015-02-21 DIAGNOSIS — Z0130 Encounter for examination of blood pressure without abnormal findings: Secondary | ICD-10-CM

## 2015-02-21 DIAGNOSIS — Z136 Encounter for screening for cardiovascular disorders: Secondary | ICD-10-CM

## 2015-02-21 DIAGNOSIS — R030 Elevated blood-pressure reading, without diagnosis of hypertension: Secondary | ICD-10-CM

## 2015-02-21 NOTE — Progress Notes (Signed)
   Patient in nurse clinic for blood pressure check.  BP 170/90 manaully, heart rate 92.  Patient stated he get very nervous when he is at the doctors office.  Patient gave nurse a list of home blood pressure readings.  List placed in provider box for review. Patient denied any symptoms today.  Reviewed with patient when to call clinic or go to ED if he develops chest pain, SOB, dizziness, headache, arm pain, or visual changes.  Will forward to PCP.  Derl Barrow, RN

## 2015-02-21 NOTE — Progress Notes (Signed)
Office BP noted.

## 2015-03-07 ENCOUNTER — Ambulatory Visit (INDEPENDENT_AMBULATORY_CARE_PROVIDER_SITE_OTHER): Payer: 59 | Admitting: *Deleted

## 2015-03-07 VITALS — BP 150/80 | HR 92

## 2015-03-07 DIAGNOSIS — Z0130 Encounter for examination of blood pressure without abnormal findings: Secondary | ICD-10-CM

## 2015-03-07 DIAGNOSIS — IMO0001 Reserved for inherently not codable concepts without codable children: Secondary | ICD-10-CM

## 2015-03-07 DIAGNOSIS — R030 Elevated blood-pressure reading, without diagnosis of hypertension: Secondary | ICD-10-CM

## 2015-03-07 DIAGNOSIS — Z136 Encounter for screening for cardiovascular disorders: Secondary | ICD-10-CM

## 2015-03-07 NOTE — Progress Notes (Signed)
  Patient in nurse clinic for blood pressure check.  BP 170/80 manually, heart rate 92.  Patient kept a record blood pressure reading from home.  The readings are normal.  Patient stated that once he is in the doctors office his blood pressure goes up.  He denies any symptoms today.  Denies smoking a cigarette before appointment. Blood pressure recheck 150/80 manually.  Will forward to PCP.  Derl Barrow, RN

## 2015-03-15 ENCOUNTER — Ambulatory Visit (INDEPENDENT_AMBULATORY_CARE_PROVIDER_SITE_OTHER): Payer: 59 | Admitting: Family Medicine

## 2015-03-15 ENCOUNTER — Encounter: Payer: Self-pay | Admitting: Family Medicine

## 2015-03-15 VITALS — BP 182/88 | HR 91 | Temp 98.2°F | Ht 69.0 in | Wt 163.3 lb

## 2015-03-15 DIAGNOSIS — M25561 Pain in right knee: Secondary | ICD-10-CM

## 2015-03-15 DIAGNOSIS — M1711 Unilateral primary osteoarthritis, right knee: Secondary | ICD-10-CM

## 2015-03-15 DIAGNOSIS — B029 Zoster without complications: Secondary | ICD-10-CM | POA: Diagnosis not present

## 2015-03-15 DIAGNOSIS — IMO0001 Reserved for inherently not codable concepts without codable children: Secondary | ICD-10-CM

## 2015-03-15 DIAGNOSIS — R030 Elevated blood-pressure reading, without diagnosis of hypertension: Secondary | ICD-10-CM | POA: Diagnosis not present

## 2015-03-15 MED ORDER — METHYLPREDNISOLONE ACETATE 40 MG/ML IJ SUSP
40.0000 mg | Freq: Once | INTRAMUSCULAR | Status: AC
  Administered 2015-03-15: 40 mg via INTRA_ARTICULAR

## 2015-03-15 MED ORDER — ZOSTER VACCINE LIVE 19400 UNT/0.65ML ~~LOC~~ SOLR: 0.6500 mL | Freq: Once | SUBCUTANEOUS | Status: DC

## 2015-03-15 NOTE — Patient Instructions (Signed)
Knee Injection, Care After Refer to this sheet in the next few weeks. These instructions provide you with information about caring for yourself after your procedure. Your health care provider may also give you more specific instructions. Your treatment has been planned according to current medical practices, but problems sometimes occur. Call your health care provider if you have any problems or questions after your procedure. WHAT TO EXPECT AFTER THE PROCEDURE After your procedure, it is common to have:  Soreness.  Warmth.  Swelling. You may have more pain, swelling, and warmth than you did before the injection. This reaction may last for about one day.  HOME CARE INSTRUCTIONS Bathing  If you were given a bandage (dressing), keep it dry until your health care provider says it can be removed. Ask your health care provider when you can start showering or taking a bath. Managing Pain, Stiffness, and Swelling  If directed, apply ice to the injection area:  Put ice in a plastic bag.  Place a towel between your skin and the bag.  Leave the ice on for 20 minutes, 2-3 times per day.  Do not apply heat to your knee.  Raise the injection area above the level of your heart while you are sitting or lying down. Activity  Avoid strenuous activities for as long as directed by your health care provider. Ask your health care provider when you can return to your normal activities. General Instructions  Take medicines only as directed by your health care provider.  Do not take aspirin or other over-the-counter medicines unless your health care provider says you can.  Check your injection site every day for signs of infection. Watch for:  Redness, swelling, or pain.  Fluid, blood, or pus.  Follow your health care provider's instructions about dressing changes and removal. SEEK MEDICAL CARE IF:  You have symptoms at your injection site that last longer than two days after your  procedure.  You have redness, swelling, or pain in your injection area.  You have fluid, blood, or pus coming from your injection site.  You have warmth in your injection area.  You have a fever.  Your pain is not controlled with medicine. SEEK IMMEDIATE MEDICAL CARE IF:  Your knee turns very red.  Your knee becomes very swollen.  Your knee pain is severe.   This information is not intended to replace advice given to you by your health care provider. Make sure you discuss any questions you have with your health care provider.   Document Released: 06/16/2014 Document Reviewed: 06/16/2014 Elsevier Interactive Patient Education 2016 [employer]  

## 2015-03-16 ENCOUNTER — Encounter: Payer: Self-pay | Admitting: Family Medicine

## 2015-03-16 NOTE — Progress Notes (Signed)
   Subjective:    Patient ID: Samuel Gross, male    DOB: 03-24-1951, 64 y.o.   MRN: [id number]  HPI  Follow up elevated BP reading in office - Patient has had both nurse bp checks since office visit 02/02/15 ov and has kept home BP measurement with wrist-type BP automatic cuff - Elevated BP readings by nursing checks - Normal BP readings at home in 120 to 130 /70s range.  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 - anterior pain of knee making it harder to walk at work - Would like to try corticosteroid knee injection today.  He has not had one before.   (+) smoking. Not interested in quiting at this time.   Review of Systems   No knee swelling No fever No leg edema No shortness of breath     Objective:   Physical Exam VS reviewed Gen: NAD, groomed, cooperative Cor: RRR, no murmur or gallup, normal PMI.  No jvd.  Lung BCTA, no acc mm use.  Right knee: no swelling, no erythema, (+) tender along joint line.  FROM both Active and Passive flexion and extension.  Normal muscle tone. (+) 1 DP pulse right foot.  Neuro: Non-atalgic gait.  Psych: normal affect, clear speech, language concrete and directed.   Knee Joint Injection Procedure Note  Pre-operative Diagnosis: right osteoarthritis  Post-operative Diagnosis: same  Indications: Symptom relief from osteoarthritis  Anesthesia: Lidocaine 1% without epinephrine without added sodium bicarbonate  Procedure Details   Written consent was obtained for the procedure. The anterolateral right knee joint was prepped with Betadine  A 25 gauge needle was inserted into the lateralr aspect of the anterior joint. 4 ml 1% lidocaine and 1 ml of Solumedrol  40mg /ml was then injected into the joint. The needle was removed and the  area cleansed and dressed.  Complications:  None; patient tolerated the procedure well.    Assessment & Plan:

## 2015-03-16 NOTE — Assessment & Plan Note (Signed)
Established problem worsened.  Injected intra-articular knee joint with 40 mg Solumedrol without complications.  Patient to use ice today. Use APAP and IBuprofen as needed.  Scheduled APAP did not provide greater analgesic relief than prn use per pt.  Pt ed on monitoring after knee injection given to pt.

## 2015-03-16 NOTE — Assessment & Plan Note (Signed)
Established problem. Stable. No evidence of end organ damage.  Difference significant difference in BP measures between office and home BP measurements make white-coat BP elevations likely.  Asked patient to continue weekly home BP monitoring and to let us know if he begins to see readings greater than 140/85.  Encourgaged smoking cessation, lifestyle modifications and consideration of statin in future given elevated ACC/AHA 10-yr risk

## 2015-07-25 ENCOUNTER — Telehealth: Payer: Self-pay | Admitting: Family Medicine

## 2015-07-25 NOTE — Telephone Encounter (Signed)
Needs knee injection in right knee but there are no appts march 23.  Doesn't feel like he can wait that long. Please advise

## 2015-07-25 NOTE — Telephone Encounter (Signed)
Spoke with patient and informed of the below.  He will check with wife and get back to [place] in the am. Carmelina Balducci, Salome Spotted

## 2015-07-25 NOTE — Telephone Encounter (Signed)
Please let Mr Burris know he may come in for an SDA for his knees or make an appointment with Sports Medicine for his knees.

## 2015-07-27 DIAGNOSIS — M1711 Unilateral primary osteoarthritis, right knee: Secondary | ICD-10-CM | POA: Diagnosis not present

## 2015-07-30 MED FILL — HYDROCODON-APAP 5-325: 5-325 | 10 days supply | Qty: 30 | Fill #0

## 2015-09-03 ENCOUNTER — Telehealth: Payer: Self-pay | Admitting: Family Medicine

## 2015-09-03 MED ORDER — PENICILLIN V POTASSIUM 500 MG PO TABS: 500.0000 mg | ORAL_TABLET | Freq: Four times a day (QID) | ORAL | Status: DC

## 2015-09-03 MED FILL — PENICILLIN VK 500 MG TABLET: 500 | 7 days supply | Qty: 28 | Fill #0

## 2015-09-03 NOTE — Progress Notes (Unsigned)
Mychart email from patient's spouse, Duayne Cal, reporting patient with acute toothache. " Langford is dealing with a toothache. He will not be able to go to the dentist until the week of 09-10-15. Is there anyway you can call in an antibiotic or does he need to come in. He uses the [facility] medicine on [address],    [place]    Pt without allergies. Plan: PCN VK 500 mg QID for 7 days called in to pharmacy. Notified Ms Georgiann Cocker of Rx.

## 2015-09-18 DIAGNOSIS — H524 Presbyopia: Secondary | ICD-10-CM | POA: Diagnosis not present

## 2015-09-18 DIAGNOSIS — H52223 Regular astigmatism, bilateral: Secondary | ICD-10-CM | POA: Diagnosis not present

## 2015-09-18 DIAGNOSIS — H5211 Myopia, right eye: Secondary | ICD-10-CM | POA: Diagnosis not present

## 2015-10-17 DIAGNOSIS — M1711 Unilateral primary osteoarthritis, right knee: Secondary | ICD-10-CM | POA: Diagnosis not present

## 2015-10-24 ENCOUNTER — Encounter: Payer: Self-pay | Admitting: Family Medicine

## 2015-10-24 ENCOUNTER — Ambulatory Visit (INDEPENDENT_AMBULATORY_CARE_PROVIDER_SITE_OTHER): Payer: 59 | Admitting: Family Medicine

## 2015-10-24 VITALS — BP 170/83 | HR 70 | Temp 97.9°F | Ht 69.0 in | Wt 161.0 lb

## 2015-10-24 DIAGNOSIS — F17200 Nicotine dependence, unspecified, uncomplicated: Secondary | ICD-10-CM | POA: Diagnosis not present

## 2015-10-24 DIAGNOSIS — N528 Other male erectile dysfunction: Secondary | ICD-10-CM | POA: Diagnosis not present

## 2015-10-24 DIAGNOSIS — R030 Elevated blood-pressure reading, without diagnosis of hypertension: Secondary | ICD-10-CM | POA: Diagnosis not present

## 2015-10-24 DIAGNOSIS — N529 Male erectile dysfunction, unspecified: Secondary | ICD-10-CM

## 2015-10-24 DIAGNOSIS — IMO0001 Reserved for inherently not codable concepts without codable children: Secondary | ICD-10-CM

## 2015-10-24 MED ORDER — TADALAFIL 20 MG PO TABS: 20.0000 mg | ORAL_TABLET | Freq: Every day | ORAL | Status: DC | PRN

## 2015-10-24 MED FILL — CIALIS 20 MG TABLET: 20 | 30 days supply | Qty: 6 | Fill #0 | Status: TO

## 2015-10-24 NOTE — Patient Instructions (Signed)
Good to see you today!  Thanks for coming in.  Check your blood pressure at home and write down several times a week and bring in to see Dr McDiarmid.   If regulary > 140/90 then call him  Try the Cialis 20 mg if this is not working let us know  Stopping smoking would help with lungs and sex life and heart  Come back to see Dr Raymondo Band in 3 months  Enjoy retirement - find somehting fun to do

## 2015-10-25 DIAGNOSIS — N529 Male erectile dysfunction, unspecified: Secondary | ICD-10-CM | POA: Insufficient documentation

## 2015-10-25 NOTE — Progress Notes (Signed)
   Subjective:    Patient ID: Samuel Gross, male    DOB: Nov 25, 1950, 65 y.o.   MRN: [id number]  HPI  Here for CPE before insurance changes  Only complaint is of dificulty with erections.  Has been a problem for years.  Last used cialis a while ago prescribed by previous PCP.  Unsure of dose.  It worked ok but he wondered if was a stronger medication  Continues to smoke 1/2 ppd not interested in quitting now  Monitors his blood pressure at home thinks most readings are "good" but not sure of numbers exactly and last check months ago  Patient reports no  vision/ hearing changes,anorexia, weight change, fever ,adenopathy, persistant / recurrent hoarseness, swallowing issues, chest pain, edema,persistant / recurrent cough, hemoptysis, dyspnea(rest, exertional, paroxysmal nocturnal), gastrointestinal  bleeding (melena, rectal bleeding), abdominal pain, excessive heart burn, GU symptoms(dysuria, hematuria, pyuria, voiding/incontinence  Issues) syncope, focal weakness, severe memory loss, concerning skin lesions, depression, anxiety, abnormal bruising/bleeding, major joint swelling.    Chief Complaint noted Review of Symptoms - see HPI PMH - Smoking status noted.   Vital Signs reviewed  Review of Systems     Objective:   Physical Exam  Alert conversant Ears:  External ear exam shows no significant lesions or deformities.  Otoscopic examination reveals clear canals, tympanic membranes are intact bilaterally without bulging, retraction, inflammation or discharge. Hearing is grossly normal bilaterall Eye - Pupils Equal Round Reactive to light, Extraocular movements intact, Fundi without hemorrhage or visible lesions, Conjunctiva without redness or discharge Neck:  No deformities, thyromegaly, masses, or tenderness noted.   Supple with full range of motion without pain. Heart - Regular rate and rhythm.  No murmurs, gallops or rubs.    Lungs:  Normal respiratory effort, chest expands  symmetrically. Lungs are clear to auscultation, no crackles or wheezes. Abdomen: soft and non-tender without masses, organomegaly or hernias noted.  No guarding or rebound Male genitalia: no penile lesions or discharge no testicular masses  Skin:  Intact without suspicious lesions or rashes Abdomen: soft and non-tender without masses, organomegaly or hernias noted.  No guarding or rebound Extremities:  No cyanosis, edema, or deformity noted with good range of motion of all major joints.      Assessment & Plan:   CPE - no major issues utd on HM

## 2015-10-25 NOTE — Assessment & Plan Note (Signed)
precontemplative. 

## 2015-10-25 NOTE — Assessment & Plan Note (Signed)
BP Readings from Last 3 Encounters:  10/24/15 170/83  03/15/15 182/88  03/07/15 150/80   Not at goal.  See after visit summary.

## 2015-10-25 NOTE — Assessment & Plan Note (Signed)
Longstanding without loss of secondary sex characteristics.  Will repeat trial of cialis at the maximum dose

## 2016-07-17 ENCOUNTER — Telehealth: Payer: Self-pay | Admitting: *Deleted

## 2016-07-17 ENCOUNTER — Other Ambulatory Visit: Payer: Self-pay | Admitting: Family Medicine

## 2016-07-17 ENCOUNTER — Ambulatory Visit (INDEPENDENT_AMBULATORY_CARE_PROVIDER_SITE_OTHER): Payer: Medicare Other | Admitting: Family Medicine

## 2016-07-17 VITALS — BP 180/80 | HR 93 | Temp 98.4°F | Ht 69.0 in | Wt 172.4 lb

## 2016-07-17 DIAGNOSIS — Z136 Encounter for screening for cardiovascular disorders: Secondary | ICD-10-CM

## 2016-07-17 DIAGNOSIS — Z23 Encounter for immunization: Secondary | ICD-10-CM

## 2016-07-17 DIAGNOSIS — E7800 Pure hypercholesterolemia, unspecified: Secondary | ICD-10-CM

## 2016-07-17 DIAGNOSIS — N529 Male erectile dysfunction, unspecified: Secondary | ICD-10-CM

## 2016-07-17 DIAGNOSIS — I10 Essential (primary) hypertension: Secondary | ICD-10-CM

## 2016-07-17 DIAGNOSIS — F17200 Nicotine dependence, unspecified, uncomplicated: Secondary | ICD-10-CM

## 2016-07-17 MED ORDER — AMLODIPINE BESYLATE 5 MG PO TABS: 5.0000 mg | ORAL_TABLET | Freq: Every day | ORAL | 3 refills | Status: DC

## 2016-07-17 MED ORDER — TADALAFIL 20 MG PO TABS: 20.0000 mg | ORAL_TABLET | Freq: Every day | ORAL | 99 refills | Status: DC | PRN

## 2016-07-17 MED ORDER — TADALAFIL 20 MG PO TABS
20.0000 mg | ORAL_TABLET | Freq: Every day | ORAL | 99 refills | Status: DC | PRN
Start: 2016-07-17 — End: 2016-07-18

## 2016-07-17 MED ORDER — PNEUMOCOCCAL 13-VAL CONJ VACC IM SUSP
0.5000 mL | Freq: Once | INTRAMUSCULAR | Status: AC
  Administered 2016-07-17: 0.5 mL via INTRAMUSCULAR

## 2016-07-17 NOTE — Telephone Encounter (Signed)
Pharmacist from [facility] called stating patient brought in a Rx for Cialis.  However, he was thinking that the Rx for Sildenafil.  Patient was given a discount card for the sildenafil.  Please send in new Rx for Sildenafil. Cialis would be to expensive for patient.  Derl Barrow, RN

## 2016-07-17 NOTE — Patient Instructions (Signed)
Start Amlodipine 5 mg tablet, one tablet each morning. Watch for swelling of the ankles. Will recheck your blood pressure response to this medicine next office visit in 4 weeks. Call if you have concerns about the new medicine for blood pressure  Start a baby Aspirin (81 mg tablet) one a day to prevent heart attacks and strokes.  Karalee Height, our RN nurse, will provide you with a Welcome to Medicare visit.    We are setting up an appointment with [facility] to perform an Ultrasound of your abdominal Aorta to look for dilation, called an aneurysm.   Hypertension Hypertension, commonly called high blood pressure, is when the force of blood pumping through your arteries is too strong. Your arteries are the blood vessels that carry blood from your heart throughout your body. A blood pressure reading consists of a higher number over a lower number, such as 110/72. The higher number (systolic) is the pressure inside your arteries when your heart pumps. The lower number (diastolic) is the pressure inside your arteries when your heart relaxes. Ideally you want your blood pressure below 120/80. Hypertension forces your heart to work harder to pump blood. Your arteries may become narrow or stiff. Having untreated or uncontrolled hypertension can cause heart attack, stroke, kidney disease, and other problems. What increases the risk? Some risk factors for high blood pressure are controllable. Others are not. Risk factors you cannot control include:  Race. You may be at higher risk if you are African American.  Age. Risk increases with age.  Gender. Men are at higher risk than women before age 53 years. After age 95, women are at higher risk than men. Risk factors you can control include:  Not getting enough exercise or physical activity.  Being overweight.  Getting too much fat, sugar, calories, or salt in your diet.  Drinking too much alcohol. What are the signs or  symptoms? Hypertension does not usually cause signs or symptoms. Extremely high blood pressure (hypertensive crisis) may cause headache, anxiety, shortness of breath, and nosebleed. How is this diagnosed? To check if you have hypertension, your health care provider will measure your blood pressure while you are seated, with your arm held at the level of your heart. It should be measured at least twice using the same arm. Certain conditions can cause a difference in blood pressure between your right and left arms. A blood pressure reading that is higher than normal on one occasion does not mean that you need treatment. If it is not clear whether you have high blood pressure, you may be asked to return on a different day to have your blood pressure checked again. Or, you may be asked to monitor your blood pressure at home for 1 or more weeks. How is this treated? Treating high blood pressure includes making lifestyle changes and possibly taking medicine. Living a healthy lifestyle can help lower high blood pressure. You may need to change some of your habits. Lifestyle changes may include:  Following the DASH diet. This diet is high in fruits, vegetables, and whole grains. It is low in salt, red meat, and added sugars.  Keep your sodium intake below 2,300 mg per day.  Getting at least 30-45 minutes of aerobic exercise at least 4 times per week.  Losing weight if necessary.  Not smoking.  Limiting alcoholic beverages.  Learning ways to reduce stress. Your health care provider may prescribe medicine if lifestyle changes are not enough to get your blood pressure under control,  and if one of the following is true:  You are 33-37 years of age and your systolic blood pressure is above 140.  You are 41 years of age or older, and your systolic blood pressure is above 150.  Your diastolic blood pressure is above 90.  You have diabetes, and your systolic blood pressure is over [id number] or your diastolic  blood pressure is over 90.  You have kidney disease and your blood pressure is above 140/90.  You have heart disease and your blood pressure is above 140/90. Your personal target blood pressure may vary depending on your medical conditions, your age, and other factors. Follow these instructions at home:  Have your blood pressure rechecked as directed by your health care provider.  Take medicines only as directed by your health care provider. Follow the directions carefully. Blood pressure medicines must be taken as prescribed. The medicine does not work as well when you skip doses. Skipping doses also puts you at risk for problems.  Do not smoke.  Monitor your blood pressure at home as directed by your health care provider. Contact a health care provider if:  You think you are having a reaction to medicines taken.  You have recurrent headaches or feel dizzy.  You have swelling in your ankles.  You have trouble with your vision. Get help right away if:  You develop a severe headache or confusion.  You have unusual weakness, numbness, or feel faint.  You have severe chest or abdominal pain.  You vomit repeatedly.  You have trouble breathing. This information is not intended to replace advice given to you by your health care provider. Make sure you discuss any questions you have with your health care provider. Document Released: 05/26/2005 Document Revised: 11/01/2015 Document Reviewed: 03/18/2013 Elsevier Interactive Patient Education  2017 [place] With Less [employer] with less salt is one way to reduce the amount of sodium you get from food. Sodium raises blood pressure and causes water to be held in the body. Getting less sodium from food may help lower your blood pressure, reduce any swelling, and protect your heart, liver, and kidneys. What do I need to know about cooking with less salt?  Buy sodium-free or low-sodium products. Look on the label for the  words:  Low-sodium.  Sodium-free.  Reduced-sodium.  No salt added.  Unsalted.  Check the food label before using or buying packaged ingredients.  Look for products with no more than 150 mg of sodium in one serving.  Do not choose foods with salt as one of the first three ingredients on the ingredients list. If salt is one of the first three ingredients, it usually means the item is high in sodium because ingredients are listed in order of amount in the food item.  Use herbs, seasonings without salt, and spices as substitutes for salt in foods.  Use sodium-free baking soda when baking. What are some salt alternatives? The following are herbs, seasonings, and spices that can be used instead of salt to give taste to your food. Next to their names are foods they can be used to flavor. Herbs  Bay leaves-Soups, meat and vegetable dishes, and spaghetti sauce.  Basil-Italian dishes, soups, pasta, and fish dishes.  Cilantro-Meat, poultry, and vegetable dishes.  Chili Powder-Marinades and Mexican dishes.  Chives-Salad dressings and potato dishes.  Cumin-Mexican dishes, couscous, and meat dishes.  Dill-Fish dishes, sauces, and salads.  Fennel-Meat and vegetable dishes, breads, and cookies.  Garlic (do not  use garlic salt)-Italian dishes, meat dishes, salad dressings, and sauces.  Marjoram-Soups, potato dishes, and meat dishes.  Oregano-Pizza and spaghetti sauce.  Parsley-Salads, soups, pasta, and meat dishes.  Rosemary-Italian dishes, salad dressings, soups, and red meats.  Saffron-Fish dishes, pasta, and some poultry dishes.  Sage-Stuffings and sauces.  Tarragon-Fish and [employer].  Thyme-Stuffing, meat, and fish dishes. Herbs should be fresh or dried. Do not choose packaged mixes. Seasonings  Lemon juice-Fish dishes, poultry dishes, vegetables, and salads.  Vinegar-Salad dressings, vegetables, and fish dishes. Spices  Cinnamon-Sweet dishes (such as cakes,  cookies, and puddings).  Cloves-Gingerbread, puddings, and marinades for meats.  Curry-Vegetable dishes, fish and poultry dishes, and stir-fry dishes.  Ginger-Vegetables dishes, fish dishes, and stir-fry dishes.  Nutmeg-Pasta, vegetables, poultry, fish dishes, and custard. What are some low-sodium ingredients and foods?  Fresh or frozen fruits and vegetables with no sauce added.  Fresh or frozen whole meats, poultry, and fish with no sauce added.  Eggs.  Noodles, pasta, quinoa, rice.  Shredded or puffed wheat or puffed rice.  Regular or quick oats.  Milk, yogurt, hard cheeses, and low-sodium cheeses. Good cheese choices include Swiss, [place]. Always check the label for serving size and sodium content.  Unsalted butter or margarine.  Unsalted nuts.  Sherbet or ice cream (keep to  cup serving).  Homemade pudding.  Sodium-free baking soda and baking powder. This is not a complete list of low-sodium ingredients and foods. Contact your dietitian for more options.  What high-sodium ingredients are not recommended?  Sauces, such as mustard, barbecue sauce, soy sauce, teriyaki sauce, steak sauce, chili sauce, cocktail sauce, and tartar sauce.  Mixes, such as flavored rice.  Instant products, such as ready-made pasta.  Horseradish.  Salsa.  Packaged gravies.  Angie Fava.  Olives.  Sauerkraut.  Salted nuts.  Cured or smoked meats (such as hot dogs, bacon, salami, ham, and bologna).  Processed vegetable juices, such as tomato juice.  Buttermilk.  Processed cheeses (such as cheese dips or cheese spread).  Cottage cheese.  Instant hot cereals.  Dessert mixes (ready-to-make) and store-bought cakes and pies.  Crackers with salted tops. This is not a complete list of high-sodium ingredients. Contact your dietitian for more options.  This information is not intended to replace advice given to you by your health care provider. Make sure you  discuss any questions you have with your health care provider. Document Released: 05/26/2005 Document Revised: 11/01/2015 Document Reviewed: 04/18/2013 Elsevier Interactive Patient Education  2017 [place].   Abdominal Aortic Aneurysm Blood pumps away from the heart through tubes (blood vessels) called arteries. Aneurysms are weak or damaged places in the wall of an artery. It bulges out like a balloon. An abdominal aortic aneurysm happens in the main artery of the body (aorta). It can burst or tear, causing bleeding inside the body. This is an emergency. It needs treatment right away. What are the causes? The exact cause is unknown. Things that could cause this problem include:  Fat and other substances building up in the lining of a tube.  Swelling of the walls of a blood vessel.  Certain tissue diseases.  Belly (abdominal) trauma.  An infection in the main artery of the body. What increases the risk? There are things that make it more likely for you to have an aneurysm. These include:  Being over the age of 66 years old.  Having high blood pressure (hypertension).  Being a male.  Being white.  Being very overweight (obese).  Having a family history of aneurysm.  Using tobacco products. What are the signs or symptoms? Symptoms depend on the size of the aneurysm and how fast it grows. There may not be symptoms. If symptoms occur, they can include:  Pain (belly, side, lower back, or groin).  Feeling full after eating a small amount of food.  Feeling sick to your stomach (nauseous), throwing up (vomiting), or both.  Feeling a lump in your belly that feels like it is beating (pulsating).  Feeling like you will pass out (faint). How is this treated?  Medicine to control blood pressure and pain.  Imaging tests to see if the aneurysm gets bigger.  Surgery. How is this prevented? To lessen your chance of getting this condition:  Stop smoking. Stop chewing  tobacco.  Limit or avoid alcohol.  Keep your blood pressure, blood sugar, and cholesterol within normal limits.  Eat less salt.  Eat foods low in saturated fats and cholesterol. These are found in animal and whole dairy products.  Eat more fiber. Fiber is found in whole grains, vegetables, and fruits.  Keep a healthy weight.  Stay active and exercise often. This information is not intended to replace advice given to you by your health care provider. Make sure you discuss any questions you have with your health care provider. Document Released: 09/20/2012 Document Revised: 11/01/2015 Document Reviewed: 06/25/2012 Elsevier Interactive Patient Education  2017 [employer].

## 2016-07-18 MED ORDER — SILDENAFIL CITRATE 100 MG PO TABS: 100.0000 mg | ORAL_TABLET | Freq: Every day | ORAL | 99 refills | Status: DC | PRN

## 2016-07-18 MED ORDER — SILDENAFIL CITRATE 20 MG PO TABS: 40.0000 mg | ORAL_TABLET | ORAL | 99 refills | Status: DC | PRN

## 2016-07-18 NOTE — Telephone Encounter (Signed)
Wife called back and states that generic viagra 100mg  would cost them $900 (even with discount card) but if provider could send over 20mg  tablets, they are only $1 each.  Patient would like to start out with getting 30 tablets and see if this medication will help.  Will forward to MD to make aware. Jazmin Hartsell,CMA

## 2016-07-18 NOTE — Addendum Note (Signed)
Addended byWendy Poet, Braelen Sproule D on: 07/18/2016 02:44 PM   Modules accepted: Orders

## 2016-07-18 NOTE — Telephone Encounter (Signed)
Please let patient know that sildenafil 20 mg tablets sent to pharmacy.

## 2016-07-18 NOTE — Telephone Encounter (Signed)
Spoke with patient and he is aware of this. Jazmin Hartsell,CMA  

## 2016-07-18 NOTE — Telephone Encounter (Signed)
Please let patient know that his prescription for sildenafil was sent into the [facility].

## 2016-07-21 ENCOUNTER — Encounter: Payer: Self-pay | Admitting: Family Medicine

## 2016-07-21 NOTE — Progress Notes (Signed)
   Subjective:    Patient ID: Samuel Gross, male    DOB: November 15, 1950, 66 y.o.   MRN: [id number] Samuel Gross is alone Sources of clinical information for visit is/are patient and past medical records. Nursing assessment for this office visit was reviewed with the patient for accuracy and revision.   HPI  Elevated Blood Pressure readings  Disease Monitoring  Blood pressure range: running high at home  Chest pain: no   Dyspnea: no   Claudication: no  PMH: Prior office BP readins elevated    Preventitive Healthcare:  Exercise: no   Diet Pattern: no   Salt Restriction: no  Erectile Dysfunction  Onset several years ago Quality: erection to soft for penetration Insufficient response to Maximum dose Cialis.   No nocturnal tumescence Smokes High cholesterol Context:now that retired his intimacy with wife hs increased. He does have desire for sexual intercourse.  Associated Signs and Symptoms: no claudication  SH: Smoking   Review of Systems  No galactorrhea No change in vision.      Objective:   Physical Exam VS reviewed GEN: Alert, Cooperative, Groomed, NAD HEENT: PERRL; EAC bilaterally not occluded, TM's translucent with normal LM, (+) LR;                No cervical LAN, No thyromegaly, No palpable masses COR: RRR, No M/G/R, No JVD, Normal PMI size and location LUNGS: BCTA, No Acc mm use, speaking in full sentences ABDOMEN: (+)BS, soft, NT, ND, No HSM, No palpable masses EXT: No peripheral leg edema. Feet without deformity or lesions. Unable palpate pulses DP/PT.  Neuro: Oriented to person, place, and time;  Gait: Normal speed, No significant path deviation, Step through +,  Psych: Normal affect/thought/speech/language        Assessment & Plan:

## 2016-07-21 NOTE — Assessment & Plan Note (Signed)
Established problem worsened.  Trial of sildenafil 20 mg tablets, 3 tablets prn.  Coupon trial Good Rx Suspect this is atherosclerosis related given his multiple risk factors

## 2016-07-21 NOTE — Assessment & Plan Note (Signed)
Role in erectile dysfunction.

## 2016-07-21 NOTE — Assessment & Plan Note (Signed)
Old problem ASCVD 10-yr risk = 21%.  Recommendation moderate to high intensity statin therapy Samuel Gross is resistant to starting a statin.

## 2016-07-21 NOTE — Assessment & Plan Note (Signed)
New problem Start Amlodipine 5 mg daily Low salt diet Exercise.  Return to clinic 4 weeks to assess tolerance of medicatin and bp response

## 2016-07-22 ENCOUNTER — Telehealth: Payer: Self-pay | Admitting: *Deleted

## 2016-07-22 NOTE — Telephone Encounter (Signed)
Spoke to patient's wife who stated patient is at store. Will have patient call back to schedule AWV with this RN. Hubbard Hartshorn, RN, BSN

## 2016-07-22 NOTE — Telephone Encounter (Signed)
-----   Message from Blane Ohara McDiarmid, MD sent at 07/18/2016  4:43 PM EST ----- Regarding: Patient for Welcome to Medicare Visit Lauren -  Could you call Mr Selma Bryer to set up a Welcome to Medicare Visit?   He said he would be willing to come to the visit as long as Medicare is paying for it.  Sherren Mocha

## 2016-07-25 ENCOUNTER — Ambulatory Visit (HOSPITAL_COMMUNITY): Payer: Medicare Other

## 2016-07-29 ENCOUNTER — Ambulatory Visit (INDEPENDENT_AMBULATORY_CARE_PROVIDER_SITE_OTHER): Payer: Medicare Other | Admitting: *Deleted

## 2016-07-29 ENCOUNTER — Encounter: Payer: Self-pay | Admitting: *Deleted

## 2016-07-29 ENCOUNTER — Other Ambulatory Visit: Payer: Self-pay | Admitting: Family Medicine

## 2016-07-29 VITALS — BP 140/56 | HR 102 | Temp 98.7°F | Ht 69.0 in | Wt 168.2 lb

## 2016-07-29 DIAGNOSIS — R413 Other amnesia: Secondary | ICD-10-CM

## 2016-07-29 DIAGNOSIS — H6123 Impacted cerumen, bilateral: Secondary | ICD-10-CM

## 2016-07-29 DIAGNOSIS — Z0000 Encounter for general adult medical examination without abnormal findings: Secondary | ICD-10-CM

## 2016-07-29 MED ORDER — PENICILLIN V POTASSIUM 500 MG PO TABS: 500.0000 mg | ORAL_TABLET | Freq: Four times a day (QID) | ORAL | 0 refills | Status: AC

## 2016-07-29 NOTE — Progress Notes (Unsigned)
Right maxillary premolar pain c/w dental abscess.  No visible edema or erythema at tood site.  Rx Pen VK 500 mg QID for 10 days Go for Dentistry evaluation ASAP to avoid complication of odontogenic infections.

## 2016-07-29 NOTE — Progress Notes (Signed)
Subjective:   Samuel Gross is a 66 y.o. male who presents for an Initial Medicare Annual Wellness Visit.  Cardiac Risk Factors include: advanced age (>35men, >51 women);dyslipidemia;hypertension;male gender;smoking/ tobacco exposure;sedentary lifestyle    Objective:    Today's Vitals   07/29/16 1008  BP: (!) 156/56  Pulse: (!) 102  Temp: 98.7 F (37.1 C)  TempSrc: Oral  SpO2: 98%  Weight: 168 lb 3.2 oz (76.3 kg)  Height: 5\' 9"  (1.753 m)  PainSc: 4   PainLoc: Knee   Body mass index is 24.84 kg/m. Patient brought log of home BP readings:  Ranging 108-138/58-85. BP check on patient's wrist unit 142/92 HR 104 BP recheck at 1100 140/56  left arm manually with adult cuff. PCP informed.  Current Medications (verified) Outpatient Encounter Prescriptions as of 07/29/2016  Medication Sig  . acetaminophen (TYLENOL) 500 MG tablet Take 2 tablets (1,000 mg total) by mouth every 6 (six) hours as needed (Knee osteoarthrtis pain).  Marland Kitchen amLODipine (NORVASC) 5 MG tablet Take 1 tablet (5 mg total) by mouth daily.  Marland Kitchen ibuprofen (ADVIL) 200 MG tablet Take 200 mg by mouth every 6 (six) hours as needed (knee osteoarthrtis pain). Reported on 10/24/2015  . sildenafil (REVATIO) 20 MG tablet Take 2 tablets (40 mg total) by mouth as needed.   No facility-administered encounter medications on file as of 07/29/2016.     Allergies (verified) Patient has no known allergies.   History: Past Medical History:  Diagnosis Date  . Back injury 2011   tore muscles ligaments and tendons  . BACK PAIN, THORACIC REGION 02/12/2010   Qualifier: Diagnosis of  By: Linna Darner MD, [place] DISEASE, LUMBAR 12/28/2009   Qualifier: Diagnosis of  By: Linna Darner MD, Gwyndolyn Saxon    . Elevated blood pressure 02/02/2015  . Hyperlipidemia 02/02/2015  . HYPERPLASIA PROSTATE UNS W/O UR OBST & OTH LUTS 12/28/2009   Qualifier: Diagnosis of  By: Linna Darner MD, Gwyndolyn Saxon    . Hypertension   . Lung nodule 02/02/2015   RUL opacity on 2012  CXR that is unchanged in size and appearance on 01/2015 CXR   . Onychomycosis 02/02/15   multiple toes on both feet  . Osteoarthritis of right knee 02/02/2015   Right medial knee osteoarthritis pain.   . Prediabetes 02/02/2015  . RAD (reactive airway disease)   . Renal cyst   . TOBACCO USER 03/17/2009   Qualifier: Diagnosis of  By: Samara Snide    . UPJ (ureteropelvic junction) obstruction 05/08/2011    With Hydronephrosis ;S/P surgical correction 06/2010 , Dr Karsten Ro    Past Surgical History:  Procedure Laterality Date  . COLONOSCOPY  2010   negative   . UPJ correction  2012   Dr Karsten Ro  . WISDOM TOOTH EXTRACTION     Family History  Problem Relation Age of Onset  . Diabetes Mother   . Heart attack Mother     in 62s  . Hypertension Mother   . Hypertension Father   . COPD Father   . Hypertension Sister   . Rheum arthritis Sister   . Hypertension Brother     X 3  . Osteoarthritis Brother   . Sarcoidosis Sister   . Hypertension Sister   . Osteoarthritis Sister   . Rheum arthritis Sister   . Hyperlipidemia Sister   . Sarcoidosis Daughter   . Osteoarthritis Brother     spine, hip (replacement)  . Hyperlipidemia Brother   . Osteoarthritis Brother   .  Hyperlipidemia Brother   . Kidney failure Brother    Social History   Occupational History  . [place] History Main Topics  . Smoking status: Current Every Day Smoker    Packs/day: 0.50    Years: 45.00    Types: Cigarettes  . Smokeless tobacco: Never Used  . Alcohol use 0.0 oz/week     Comment:  socially on weekends; 1/2 fifth; Denies drinking more than 4 alcoholic drinks in a day   . Drug use: No  . Sexual activity: No   Tobacco Counseling Ready to quit: No Counseling given: Yes Patient is not considering quitting smoking at this time. Information/literature given on 1-800-QUIT-NOW for when he is ready.  Activities of Daily Living In your present state of health, do you have any difficulty  performing the following activities: 07/29/2016  Hearing? N  Vision? N  Difficulty concentrating or making decisions? N  Walking or climbing stairs? N  Dressing or bathing? N  Doing errands, shopping? N  Preparing Food and eating ? N  Using the Toilet? N  In the past six months, have you accidently leaked urine? N  Do you have problems with loss of bowel control? N  Managing your Medications? N  Managing your Finances? N  Housekeeping or managing your Housekeeping? N  Some recent data might be hidden  Home Safety:  My home has a working smoke alarm:  Yes X 3           My home throw rugs have been fastened down to the floor or removed:  Removed I have non-slip mats in the bathtub and shower:  Yes         All my home's stairs have railings or bannisters: One level home with 2 outside stairs with railings           My home's floors, stairs and hallways are free from clutter, wires and cords:  Yes     I wear seatbelts consistently:  Yes    Immunizations and Health Maintenance Immunization History  Administered Date(s) Administered  . Influenza Whole 02/28/2008  . Influenza,inj,Quad PF,36+ Mos 02/26/2015  . Influenza-Unspecified 04/09/2016  . Pneumococcal Conjugate-13 07/17/2016  . Pneumococcal Polysaccharide-23 02/01/2015  . Td 06/09/1996  . Tdap 02/01/2015  . Zoster 02/21/2015   There are no preventive care reminders to display for this patient.  Patient Care Team: Blane Ohara McDiarmid, MD as PCP - General (Family Medicine) Raynelle Bring, MD as Consulting Physician (Urology) Suella Broad, MD as Consulting Physician (Physical Medicine and Rehabilitation) Dr. Camillo Flaming at [facility] any recent [place] you may have received from other than [facility] providers in the past year (date may be approximate).    Assessment:   This is a routine wellness examination for Samuel Gross.   Hearing/Vision screen  Hearing Screening   Method: Audiometry   125Hz  250Hz  500Hz   1000Hz  2000Hz  3000Hz  4000Hz  6000Hz  8000Hz   Right ear:   Fail Fail Fail  Fail    Left ear:   Fail Fail Fail  Fail    Patient denies difficulty hearing. Cerumen noted in both ears. Discussed using Debrox OTC for 2 weeks and scheduling appt with Nurse Clinic for ear irrigation. PCP informed.  Dietary issues and exercise activities discussed: Current Exercise Habits: The patient does not participate in regular exercise at present, Exercise limited by: orthopedic condition(s) (Right knee pain)  Goals    . Blood Pressure < 140/90    .  Exercise 3x per week (60 min per time)          Wife and patient have discussed joining [employer] with Chief of Staff    . LDL CALC < 130      Depression Screen PHQ 2/9 Scores 07/29/2016 07/17/2016 10/24/2015 03/15/2015  PHQ - 2 Score 0 0 0 0    Fall Risk Fall Risk  07/29/2016 10/24/2015 02/02/2015  Falls in the past year? No No No   TUG Test:  Done in 8 seconds. Patient used both hands to push out of chair and to sit back down.  Cognitive Function: Mini-Cog  Failed with score 2/5. PCP made aware.   Screening Tests Health Maintenance  Topic Date Due  . COLONOSCOPY  06/09/2018  . PNA vac Low Risk Adult (2 of 2 - PPSV23) 02/01/2020  . TETANUS/TDAP  01/31/2025  . INFLUENZA VACCINE  Completed  . Hepatitis C Screening  Completed  . HIV Screening  Completed        Plan:   Patient c/o upper right sided tooth pain. Given Low cost dental resources as [facility] does not cover dental.  During the course of the visit Earlie was educated and counseled about the following appropriate screening and preventive services:   Vaccines to include Pneumoccal, Influenza, Td, Zostavax,Shingrix  Colorectal cancer screening  Cardiovascular disease screening  Diabetes screening  Nutrition counseling  Smoking cessation counseling  Patient Instructions (the written plan) were given to the patient.   Velora Heckler, RN   07/29/2016

## 2016-07-29 NOTE — Patient Instructions (Signed)
Fat and Cholesterol Restricted Diet High levels of fat and cholesterol in your blood may lead to various health problems, such as diseases of the heart, blood vessels, gallbladder, liver, and pancreas. Fats are concentrated sources of energy that come in various forms. Certain types of fat, including saturated fat, may be harmful in excess. Cholesterol is a substance needed by your body in small amounts. Your body makes all the cholesterol it needs. Excess cholesterol comes from the food you eat. When you have high levels of cholesterol and saturated fat in your blood, health problems can develop because the excess fat and cholesterol will gather along the walls of your blood vessels, causing them to narrow. Choosing the right foods will help you control your intake of fat and cholesterol. This will help keep the levels of these substances in your blood within normal limits and reduce your risk of disease. What is my plan? Your health care provider recommends that you:  Limit your fat intake to ______% or less of your total calories per day.  Limit the amount of cholesterol in your diet to less than _________mg per day.  Eat 20-30 grams of fiber each day. What types of fat should I choose?  Choose healthy fats more often. Choose monounsaturated and polyunsaturated fats, such as olive and canola oil, flaxseeds, walnuts, almonds, and seeds.  Eat more omega-3 fats. Good choices include salmon, mackerel, sardines, tuna, flaxseed oil, and ground flaxseeds. Aim to eat fish at least two times a week.  Limit saturated fats. Saturated fats are primarily found in animal products, such as meats, butter, and cream. Plant sources of saturated fats include palm oil, palm kernel oil, and coconut oil.  Avoid foods with partially hydrogenated oils in them. These contain trans fats. Examples of foods that contain trans fats are stick margarine, some tub margarines, cookies, crackers, and other baked goods. What  general guidelines do I need to follow? These guidelines for healthy eating will help you control your intake of fat and cholesterol:  Check food labels carefully to identify foods with trans fats or high amounts of saturated fat.  Fill one half of your plate with vegetables and green salads.  Fill one fourth of your plate with whole grains. Look for the word "whole" as the first word in the ingredient list.  Fill one fourth of your plate with lean protein foods.  Limit fruit to two servings a day. Choose fruit instead of juice.  Eat more foods that contain fiber, such as apples, broccoli, carrots, beans, peas, and barley.  Eat more home-cooked food and less restaurant, buffet, and fast food.  Limit or avoid alcohol.  Limit foods high in starch and sugar.  Limit fried foods.  Cook foods using methods other than frying. Baking, boiling, grilling, and broiling are all great options.  Lose weight if you are overweight. Losing just 5-10% of your initial body weight can help your overall health and prevent diseases such as diabetes and heart disease. What foods can I eat? Grains  Whole grains, such as whole wheat or whole grain breads, crackers, cereals, and pasta. Unsweetened oatmeal, bulgur, barley, quinoa, or brown rice. Corn or whole wheat flour tortillas. Vegetables  Fresh or frozen vegetables (raw, steamed, roasted, or grilled). Green salads. Fruits  All fresh, canned (in natural juice), or frozen fruits. Meats and other protein foods  Ground beef (85% or leaner), grass-fed beef, or beef trimmed of fat. Skinless chicken or [place]. Ground chicken or [place]. Pork trimmed  of fat. All fish and seafood. Eggs. Dried beans, peas, or lentils. Unsalted nuts or seeds. Unsalted canned or dry beans. Dairy  Low-fat dairy products, such as skim or 1% milk, 2% or reduced-fat cheeses, low-fat ricotta or cottage cheese, or plain low-fat yo Fats and oils  Tub margarines without trans fats.  Light or reduced-fat mayonnaise and salad dressings. Avocado. Olive, canola, sesame, or safflower oils. Natural peanut or almond butter (choose ones without added sugar and oil). The items listed above may not be a complete list of recommended foods or beverages. Contact your dietitian for more options.  Foods to avoid Grains  White bread. White pasta. White rice. Cornbread. Bagels, pastries, and croissants. Crackers that contain trans fat. Vegetables  White potatoes. Corn. Creamed or fried vegetables. Vegetables in a cheese sauce. Fruits  Dried fruits. Canned fruit in light or heavy syrup. Fruit juice. Meats and other protein foods  Fatty cuts of meat. Ribs, chicken wings, bacon, sausage, bologna, salami, chitterlings, fatback, hot dogs, bratwurst, and packaged luncheon meats. Liver and organ meats. Dairy  Whole or 2% milk, cream, half-and-half, and cream cheese. Whole milk cheeses. Whole-fat or sweetened yogurt. Full-fat cheeses. Nondairy creamers and whipped toppings. Processed cheese, cheese spreads, or cheese curds. Beverages  Alcohol. Sweetened drinks (such as sodas, lemonade, and fruit drinks or punches). Fats and oils  Butter, stick margarine, lard, shortening, ghee, or bacon fat. Coconut, palm kernel, or palm oils. Sweets and desserts  Corn syrup, sugars, honey, and molasses. Candy. Jam and jelly. Syrup. Sweetened cereals. Cookies, pies, cakes, donuts, muffins, and ice cream. The items listed above may not be a complete list of foods and beverages to avoid. Contact your dietitian for more information.  This information is not intended to replace advice given to you by your health care provider. Make sure you discuss any questions you have with your health care provider. Document Released: 05/26/2005 Document Revised: 06/16/2014 Document Reviewed: 08/24/2013 Elsevier Interactive Patient Education  2017 [place] Prevention in the Home Introduction Falls can  cause injuries. They can happen to people of all ages. There are many things you can do to make your home safe and to help prevent falls. What can I do on the outside of my home?  Regularly fix the edges of walkways and driveways and fix any cracks.  Remove anything that might make you trip as you walk through a door, such as a raised step or threshold.  Trim any bushes or trees on the path to your home.  Use bright outdoor lighting.  Clear any walking paths of anything that might make someone trip, such as rocks or tools.  Regularly check to see if handrails are loose or broken. Make sure that both sides of any steps have handrails.  Any raised decks and porches should have guardrails on the edges.  Have any leaves, snow, or ice cleared regularly.  Use sand or salt on walking paths during winter.  Clean up any spills in your garage right away. This includes oil or grease spills. What can I do in the bathroom?  Use night lights.  Install grab bars by the toilet and in the tub and shower. Do not use towel bars as grab bars.  Use non-skid mats or decals in the tub or shower.  If you need to sit down in the shower, use a plastic, non-slip stool.  Keep the floor dry. Clean up any water that spills on the floor as soon as it  happens.  Remove soap buildup in the tub or shower regularly.  Attach bath mats securely with double-sided non-slip rug tape.  Do not have throw rugs and other things on the floor that can make you trip. What can I do in the bedroom?  Use night lights.  Make sure that you have a light by your bed that is easy to reach.  Do not use any sheets or blankets that are too big for your bed. They should not hang down onto the floor.  Have a firm chair that has side arms. You can use this for support while you get dressed.  Do not have throw rugs and other things on the floor that can make you trip. What can I do in the kitchen?  Clean up any spills right  away.  Avoid walking on wet floors.  Keep items that you use a lot in easy-to-reach places.  If you need to reach something above you, use a strong step stool that has a grab bar.  Keep electrical cords out of the way.  Do not use floor polish or wax that makes floors slippery. If you must use wax, use non-skid floor wax.  Do not have throw rugs and other things on the floor that can make you trip. What can I do with my stairs?  Do not leave any items on the stairs.  Make sure that there are handrails on both sides of the stairs and use them. Fix handrails that are broken or loose. Make sure that handrails are as long as the stairways.  Check any carpeting to make sure that it is firmly attached to the stairs. Fix any carpet that is loose or worn.  Avoid having throw rugs at the top or bottom of the stairs. If you do have throw rugs, attach them to the floor with carpet tape.  Make sure that you have a light switch at the top of the stairs and the bottom of the stairs. If you do not have them, ask someone to add them for you. What else can I do to help prevent falls?  Wear shoes that:  Do not have high heels.  Have rubber bottoms.  Are comfortable and fit you well.  Are closed at the toe. Do not wear sandals.  If you use a stepladder:  Make sure that it is fully opened. Do not climb a closed stepladder.  Make sure that both sides of the stepladder are locked into place.  Ask someone to hold it for you, if possible.  Clearly mark and make sure that you can see:  Any grab bars or handrails.  First and last steps.  Where the edge of each step is.  Use tools that help you move around (mobility aids) if they are needed. These include:  Canes.  Walkers.  Scooters.  Crutches.  Turn on the lights when you go into a dark area. Replace any light bulbs as soon as they burn out.  Set up your furniture so you have a clear path. Avoid moving your furniture  around.  If any of your floors are uneven, fix them.  If there are any pets around you, be aware of where they are.  Review your medicines with your doctor. Some medicines can make you feel dizzy. This can increase your chance of falling. Ask your doctor what other things that you can do to help prevent falls. This information is not intended to replace advice given to you by  your health care provider. Make sure you discuss any questions you have with your health care provider. Document Released: 03/22/2009 Document Revised: 11/01/2015 Document Reviewed: 06/30/2014  2017 Elsevier  Health Maintenance, Male A healthy lifestyle and preventative care can promote health and wellness.  Maintain regular health, dental, and eye exams.  Eat a healthy diet. Foods like vegetables, fruits, whole grains, low-fat dairy products, and lean protein foods contain the nutrients you need and are low in calories. Decrease your intake of foods high in solid fats, added sugars, and salt. Get information about a proper diet from your health care provider, if necessary.  Regular physical exercise is one of the most important things you can do for your health. Most adults should get at least 150 minutes of moderate-intensity exercise (any activity that increases your heart rate and causes you to sweat) each week. In addition, most adults need muscle-strengthening exercises on 2 or more days a week.   Maintain a healthy weight. The body mass index (BMI) is a screening tool to identify possible weight problems. It provides an estimate of body fat based on height and weight. Your health care provider can find your BMI and can help you achieve or maintain a healthy weight. For males 20 years and older:  A BMI below 18.5 is considered underweight.  A BMI of 18.5 to 24.9 is normal.  A BMI of 25 to 29.9 is considered overweight.  A BMI of 30 and above is considered obese.  Maintain normal blood lipids and  cholesterol by exercising and minimizing your intake of saturated fat. Eat a balanced diet with plenty of fruits and vegetables. Blood tests for lipids and cholesterol should begin at age 80 and be repeated every 5 years. If your lipid or cholesterol levels are high, you are over age 37, or you are at high risk for heart disease, you may need your cholesterol levels checked more frequently.Ongoing high lipid and cholesterol levels should be treated with medicines if diet and exercise are not working.  If you smoke, find out from your health care provider how to quit. If you do not use tobacco, do not start.  Lung cancer screening is recommended for adults aged 95-80 years who are at high risk for developing lung cancer because of a history of smoking. A yearly low-dose CT scan of the lungs is recommended for people who have at least a 30-pack-year history of smoking and are current smokers or have quit within the past 15 years. A pack year of smoking is smoking an average of 1 pack of cigarettes a day for 1 year (for example, a 30-pack-year history of smoking could mean smoking 1 pack a day for 30 years or 2 packs a day for 15 years). Yearly screening should continue until the smoker has stopped smoking for at least 15 years. Yearly screening should be stopped for people who develop a health problem that would prevent them from having lung cancer treatment.  If you choose to drink alcohol, do not have more than 2 drinks per day. One drink is considered to be 12 oz (360 mL) of beer, 5 oz (150 mL) of wine, or 1.5 oz (45 mL) of liquor.  Avoid the use of street drugs. Do not share needles with anyone. Ask for help if you need support or instructions about stopping the use of drugs.  High blood pressure causes heart disease and increases the risk of stroke. High blood pressure is more likely to develop in:  People who have blood pressure in the end of the normal range (100-139/85-89 mm Hg).  People who are  overweight or obese.  People who are African American.  If you are 60-76 years of age, have your blood pressure checked every 3-5 years. If you are 64 years of age or older, have your blood pressure checked every year. You should have your blood pressure measured twice-once when you are at a hospital or clinic, and once when you are not at a hospital or clinic. Record the average of the two measurements. To check your blood pressure when you are not at a hospital or clinic, you can use:  An automated blood pressure machine at a pharmacy.  A home blood pressure monitor.  If you are 75-29 years old, ask your health care provider if you should take aspirin to prevent heart disease.  Diabetes screening involves taking a blood sample to check your fasting blood sugar level. This should be done once every 3 years after age 41 if you are at a normal weight and without risk factors for diabetes. Testing should be considered at a younger age or be carried out more frequently if you are overweight and have at least 1 risk factor for diabetes.  Colorectal cancer can be detected and often prevented. Most routine colorectal cancer screening begins at the age of 38 and continues through age 37. However, your health care provider may recommend screening at an earlier age if you have risk factors for colon cancer. On a yearly basis, your health care provider may provide home test kits to check for hidden blood in the stool. A small camera at the end of a tube may be used to directly examine the colon (sigmoidoscopy or colonoscopy) to detect the earliest forms of colorectal cancer. Talk to your health care provider about this at age 13 when routine screening begins. A direct exam of the colon should be repeated every 5-10 years through age 110, unless early forms of precancerous polyps or small growths are found.  People who are at an increased risk for hepatitis B should be screened for this virus. You are considered  at high risk for hepatitis B if:  You were born in a country where hepatitis B occurs often. Talk with your health care provider about which countries are considered high risk.  Your parents were born in a high-risk country and you have not received a shot to protect against hepatitis B (hepatitis B vaccine).  You have HIV or AIDS.  You use needles to inject street drugs.  You live with, or have sex with, someone who has hepatitis B.  You are a man who has sex with other men (MSM).  You get hemodialysis treatment.  You take certain medicines for conditions like cancer, organ transplantation, and autoimmune conditions.  Hepatitis C blood testing is recommended for all people born from [birth year] through 1965 and any individual with known risk factors for hepatitis C.  Healthy men should no longer receive prostate-specific antigen (PSA) blood tests as part of routine cancer screening. Talk to your health care provider about prostate cancer screening.  Testicular cancer screening is not recommended for adolescents or adult males who have no symptoms. Screening includes self-exam, a health care provider exam, and other screening tests. Consult with your health care provider about any symptoms you have or any concerns you have about testicular cancer.  Practice safe sex. Use condoms and avoid high-risk sexual practices to reduce the spread of  sexually transmitted infections (STIs).  You should be screened for STIs, including gonorrhea and chlamydia if:  You are sexually active and are younger than 24 years.  You are older than 24 years, and your health care provider tells you that you are at risk for this type of infection.  Your sexual activity has changed since you were last screened, and you are at an increased risk for chlamydia or gonorrhea. Ask your health care provider if you are at risk.  If you are at risk of being infected with HIV, it is recommended that you take a prescription  medicine daily to prevent HIV infection. This is called pre-exposure prophylaxis (PrEP). You are considered at risk if:  You are a man who has sex with other men (MSM).  You are a heterosexual man who is sexually active with multiple partners.  You take drugs by injection.  You are sexually active with a partner who has HIV.  Talk with your health care provider about whether you are at high risk of being infected with HIV. If you choose to begin PrEP, you should first be tested for HIV. You should then be tested every 3 months for as long as you are taking PrEP.  Use sunscreen. Apply sunscreen liberally and repeatedly throughout the day. You should seek shade when your shadow is shorter than you. Protect yourself by wearing long sleeves, pants, a wide-brimmed hat, and sunglasses year round whenever you are outdoors.  Tell your health care provider of new moles or changes in moles, especially if there is a change in shape or color. Also, tell your health care provider if a mole is larger than the size of a pencil eraser.  A one-time screening for abdominal aortic aneurysm (AAA) and surgical repair of large AAAs by ultrasound is recommended for men aged 61-75 years who are current or former smokers.  Stay current with your vaccines (immunizations). This information is not intended to replace advice given to you by your health care provider. Make sure you discuss any questions you have with your health care provider. Document Released: 11/22/2007 Document Revised: 06/16/2014 Document Reviewed: 02/27/2015 Elsevier Interactive Patient Education  2017 [place].   Hearing Loss Introduction Hearing loss is a partial or total loss of the ability to hear. This can be temporary or permanent, and it can happen in one or both ears. Hearing loss may be referred to as deafness. Medical care is necessary to treat hearing loss properly and to prevent the condition from getting worse. Your hearing may  partially or completely come back, depending on what caused your hearing loss and how severe it is. In some cases, hearing loss is permanent. What are the causes? Common causes of hearing loss include:  Too much wax in the ear canal.  Infection of the ear canal or middle ear.  Fluid in the middle ear.  Injury to the ear or surrounding area.  An object stuck in the ear.  Prolonged exposure to loud sounds, such as music. Less common causes of hearing loss include:  Tumors in the ear.  Viral or bacterial infections, such as meningitis.  A hole in the eardrum (perforated eardrum).  Problems with the hearing nerve that sends signals between the brain and the ear.  Certain medicines. What are the signs or symptoms? Symptoms of this condition may include:  Difficulty telling the difference between sounds.  Difficulty following a conversation when there is background noise.  Lack of response to sounds in  your environment. This may be most noticeable when you do not respond to startling sounds.  Needing to turn up the volume on the television, radio, etc.  Ringing in the ears.  Dizziness.  Pain in the ears. How is this diagnosed? This condition is diagnosed based on a physical exam and a hearing test (audiometry). The audiometry test will be performed by a hearing specialist (audiologist). You may also be referred to an ear, nose, and throat (ENT) specialist (otolaryngologist). How is this treated? Treatment for recent onset of hearing loss may include:  Ear wax removal.  Being prescribed medicines to prevent infection (antibiotics).  Being prescribed medicines to reduce inflammation (corticosteroids). Follow these instructions at home:  If you were prescribed an antibiotic medicine, take it as told by your health care provider. Do not stop taking the antibiotic even if you start to feel better.  Take over-the-counter and prescription medicines only as told by your  health care provider.  Avoid loud noises.  Return to your normal activities as told by your health care provider. Ask your health care provider what activities are safe for you.  Keep all follow-up visits as told by your health care provider. This is important. Contact a health care provider if:  You feel dizzy.  You develop new symptoms.  You vomit or feel nauseous.  You have a fever. Get help right away if:  You develop sudden changes in your vision.  You have severe ear pain.  You have new or increased weakness.  You have a severe headache. This information is not intended to replace advice given to you by your health care provider. Make sure you discuss any questions you have with your health care provider. Document Released: 05/26/2005 Document Revised: 11/01/2015 Document Reviewed: 10/11/2014  2017 Else[employer]   Steps to Quit Smoking Smoking tobacco can be bad for your health. It can also affect almost every organ in your body. Smoking puts you and people around you at risk for many serious long-lasting (chronic) diseases. Quitting smoking is hard, but it is one of the best things that you can do for your health. It is never too late to quit. What are the benefits of quitting smoking? When you quit smoking, you lower your risk for getting serious diseases and conditions. They can include:  Lung cancer or lung disease.  Heart disease.  Stroke.  Heart attack.  Not being able to have children (infertility).  Weak bones (osteoporosis) and broken bones (fractures). If you have coughing, wheezing, and shortness of breath, those symptoms may get better when you quit. You may also get sick less often. If you are pregnant, quitting smoking can help to lower your chances of having a baby of low birth weight. What can I do to help me quit smoking? Talk with your doctor about what can help you quit smoking. Some things you can do (strategies) include:  Quitting smoking  totally, instead of slowly cutting back how much you smoke over a period of time.  Going to in-person counseling. You are more likely to quit if you go to many counseling sessions.  Using resources and support systems, such as:  Online chats with a Social worker.  Phone quitlines.  Printed Furniture conservator/restorer.  Support groups or group counseling.  Text messaging programs.  Mobile phone apps or applications.  Taking medicines. Some of these medicines may have nicotine in them. If you are pregnant or breastfeeding, do not take any medicines to quit smoking unless your doctor  says it is okay. Talk with your doctor about counseling or other things that can help you. Talk with your doctor about using more than one strategy at the same time, such as taking medicines while you are also going to in-person counseling. This can help make quitting easier. What things can I do to make it easier to quit? Quitting smoking might feel very hard at first, but there is a lot that you can do to make it easier. Take these steps:  Talk to your family and friends. Ask them to support and encourage you.  Call phone quitlines, reach out to support groups, or work with a Social worker.  Ask people who smoke to not smoke around you.  Avoid places that make you want (trigger) to smoke, such as:  Bars.  Parties.  Smoke-break areas at work.  Spend time with people who do not smoke.  Lower the stress in your life. Stress can make you want to smoke. Try these things to help your stress:  Getting regular exercise.  Deep-breathing exercises.  Yoga.  Meditating.  Doing a body scan. To do this, close your eyes, focus on one area of your body at a time from head to toe, and notice which parts of your body are tense. Try to relax the muscles in those areas.  Download or buy apps on your mobile phone or tablet that can help you stick to your quit plan. There are many free apps, such as QuitGuide from the [employer]  Office manager for Disease Control and Prevention). You can find more support from smokefree.gov and other websites. This information is not intended to replace advice given to you by your health care provider. Make sure you discuss any questions you have with your health care provider. Document Released: 03/22/2009 Document Revised: 01/22/2016 Document Reviewed: 10/10/2014 Elsevier Interactive Patient Education  2017 [employer].

## 2016-07-30 ENCOUNTER — Encounter: Payer: Self-pay | Admitting: *Deleted

## 2016-07-30 DIAGNOSIS — H6120 Impacted cerumen, unspecified ear: Secondary | ICD-10-CM | POA: Insufficient documentation

## 2016-07-30 NOTE — Progress Notes (Signed)
I have reviewed this visit and discussed with Lauren Ducatte, RN, BSN, and agree with her documentation.   

## 2016-08-07 LAB — BASIC METABOLIC PANEL
BUN/Creatinine Ratio: 9 — ABNORMAL LOW (ref 10–24)
BUN: 10 mg/dL (ref 8–27)
CALCIUM: 9.8 mg/dL (ref 8.6–10.2)
CHLORIDE: 105 mmol/L (ref 96–106)
CO2: 29 mmol/L (ref 18–29)
Creatinine, Ser: 1.08 mg/dL (ref 0.76–1.27)
GFR calc Af Amer: 83 mL/min/{1.73_m2} (ref >59)
GFR calc non Af Amer: 72 mL/min/{1.73_m2} (ref >59)
GLUCOSE: 117 mg/dL — AB (ref 65–99)
POTASSIUM: 4.5 mmol/L (ref 3.5–5.2)
Sodium: 141 mmol/L (ref 134–144)

## 2016-08-12 ENCOUNTER — Ambulatory Visit (HOSPITAL_COMMUNITY)
Admission: RE | Admit: 2016-08-12 | Discharge: 2016-08-12 | Disposition: A | Discharge status: Home / Self Care | Payer: Medicare Other | Source: Ambulatory Visit | Attending: Family Medicine | Admitting: Family Medicine

## 2016-08-12 ENCOUNTER — Encounter: Payer: Self-pay | Admitting: Family Medicine

## 2016-08-12 DIAGNOSIS — I10 Essential (primary) hypertension: Secondary | ICD-10-CM | POA: Insufficient documentation

## 2016-08-12 DIAGNOSIS — Z136 Encounter for screening for cardiovascular disorders: Secondary | ICD-10-CM | POA: Diagnosis not present

## 2016-08-12 DIAGNOSIS — I714 Abdominal aortic aneurysm, without rupture: Secondary | ICD-10-CM | POA: Diagnosis not present

## 2016-08-21 ENCOUNTER — Ambulatory Visit (INDEPENDENT_AMBULATORY_CARE_PROVIDER_SITE_OTHER): Payer: Medicare Other | Admitting: Family Medicine

## 2016-08-21 ENCOUNTER — Encounter: Payer: Self-pay | Admitting: Family Medicine

## 2016-08-21 DIAGNOSIS — Z9189 Other specified personal risk factors, not elsewhere classified: Secondary | ICD-10-CM

## 2016-08-21 DIAGNOSIS — E7800 Pure hypercholesterolemia, unspecified: Secondary | ICD-10-CM | POA: Diagnosis not present

## 2016-08-21 DIAGNOSIS — I10 Essential (primary) hypertension: Secondary | ICD-10-CM

## 2016-08-21 DIAGNOSIS — R7303 Prediabetes: Secondary | ICD-10-CM | POA: Diagnosis not present

## 2016-08-21 DIAGNOSIS — R413 Other amnesia: Secondary | ICD-10-CM

## 2016-08-21 NOTE — Patient Instructions (Signed)
Your blood pressure is as low as Dr Stacey Sago would like to decrease it.  Stay on the one tablet of Amlodipine blood pressure medicine each day in the morning.   Keep taking your Baby aspirin once a day to prevent heart attacks and strokes.   The test of your Aorta blood vessel in your abdomin was normal.  No aneurysm.  You will not need this test again.   Dr Labrenda Lasky asks you at visits about starting a Statin medication.  He just wants you to remember it is an option to lower your cholesterol.

## 2016-08-22 ENCOUNTER — Encounter: Payer: Self-pay | Admitting: Family Medicine

## 2016-08-22 NOTE — Assessment & Plan Note (Signed)
Lab Results  Component Value Date   HGBA1C 5.9 02/01/2015  Check A1c next office visit

## 2016-08-22 NOTE — Assessment & Plan Note (Signed)
Check Minicog next visit.

## 2016-08-22 NOTE — Assessment & Plan Note (Signed)
Old problem ASCVD 10-yr risk = 21%. Recommendation moderate to high intensity statin therapy Samuel Gross continues to be resistant to starting a statin.

## 2016-08-22 NOTE — Assessment & Plan Note (Signed)
Patient taking Aspirin 81 mg daily prophylaxis Declined statin therapy - will readdress on future visits.

## 2016-08-22 NOTE — Assessment & Plan Note (Signed)
Adequate blood pressure control.  No evidence of new end organ damage.  Tolerating medication without significant adverse effects.  Plan to continue Amlodipine 5 mg daily regiment.  Low diastolic pressure may limit how much SBP reduction can be.

## 2016-08-22 NOTE — Progress Notes (Signed)
Samuel Gross is alone Sources of clinical information for visit is/are patient and past medical records. Nursing assessment for this office visit was reviewed with the patient for accuracy and revision.   HPI CC: hypertension  HYPERTENSION Diagnosed HTN last ov. Disease Monitoring  Blood pressure range: not checking at home  Chest pain: no   Dyspnea: no   Claudication: no              Leg swelling: no Medication compliance: yes, amlodipine 5 mg daily. Taking baby Aspirin daily.  Medication Side Effects  Lightheadedness: no   Urinary frequency: no   Edema: no   Impotence: yes, but improved with start of sildenaphil  HYPERLIPIDEMIA  Disease Monitoring: See symptoms for Hypertension  Medications: Compliance- Declied statin therapyfor ASCVD 10 yr event risk of 21%.  Erectile disfunction - longstanding - started sildenafil last ov.  Taking 60 mg dose with good effect, able to achieve adequate erection for climax.  - no change in vision, no priopism, no headache   ROS See HPI above   PMH Smoking Status noted   VS reviewed GEN: Alert, Cooperative, Groomed, NAD COR: RRR, No M/G/R, No JVD, Normal PMI size and location LUNGS: BCTA, No Acc mm use, speaking in full sentences .EXT: No peripheral leg edema.   Gait: Normal speed, No significant path deviation, Step through +,  Psych: Normal affect/thought/speech/language

## 2017-03-25 ENCOUNTER — Ambulatory Visit (INDEPENDENT_AMBULATORY_CARE_PROVIDER_SITE_OTHER): Payer: Medicare Other | Admitting: *Deleted

## 2017-03-25 DIAGNOSIS — Z23 Encounter for immunization: Secondary | ICD-10-CM

## 2017-07-09 ENCOUNTER — Other Ambulatory Visit: Payer: Self-pay | Admitting: *Deleted

## 2017-07-09 DIAGNOSIS — I10 Essential (primary) hypertension: Secondary | ICD-10-CM

## 2017-07-10 MED ORDER — AMLODIPINE BESYLATE 5 MG PO TABS: 5.0000 mg | ORAL_TABLET | Freq: Every day | ORAL | 3 refills | Status: DC

## 2017-11-18 ENCOUNTER — Other Ambulatory Visit: Payer: Self-pay | Admitting: Family Medicine

## 2017-11-26 ENCOUNTER — Other Ambulatory Visit: Payer: Self-pay

## 2017-11-26 DIAGNOSIS — I10 Essential (primary) hypertension: Secondary | ICD-10-CM

## 2017-11-26 NOTE — Telephone Encounter (Signed)
Patient wife called. Needs 2 week supply of Amlodipine to be sent to [facility]. States [facility] has lost last Rx. Are in process of setting up with [facility].  Danley Danker, RN [facility] RN)

## 2017-11-27 MED ORDER — AMLODIPINE BESYLATE 5 MG PO TABS: 5.0000 mg | ORAL_TABLET | Freq: Every day | ORAL | 0 refills | Status: DC

## 2017-11-30 ENCOUNTER — Other Ambulatory Visit: Payer: Self-pay | Admitting: *Deleted

## 2017-11-30 DIAGNOSIS — I10 Essential (primary) hypertension: Secondary | ICD-10-CM

## 2017-12-01 MED ORDER — AMLODIPINE BESYLATE 5 MG PO TABS: 5.0000 mg | ORAL_TABLET | Freq: Every day | ORAL | 3 refills | Status: DC

## 2018-03-18 ENCOUNTER — Encounter: Payer: Self-pay | Admitting: Family Medicine

## 2018-03-18 ENCOUNTER — Other Ambulatory Visit: Payer: Self-pay

## 2018-03-18 ENCOUNTER — Ambulatory Visit (INDEPENDENT_AMBULATORY_CARE_PROVIDER_SITE_OTHER): Payer: Medicare Other | Admitting: Family Medicine

## 2018-03-18 VITALS — BP 140/80 | HR 72 | Temp 98.7°F | Ht 69.0 in | Wt 165.0 lb

## 2018-03-18 DIAGNOSIS — F17200 Nicotine dependence, unspecified, uncomplicated: Secondary | ICD-10-CM

## 2018-03-18 DIAGNOSIS — N529 Male erectile dysfunction, unspecified: Secondary | ICD-10-CM | POA: Diagnosis not present

## 2018-03-18 DIAGNOSIS — Z23 Encounter for immunization: Secondary | ICD-10-CM

## 2018-03-18 DIAGNOSIS — H6123 Impacted cerumen, bilateral: Secondary | ICD-10-CM

## 2018-03-18 DIAGNOSIS — M1712 Unilateral primary osteoarthritis, left knee: Secondary | ICD-10-CM

## 2018-03-18 DIAGNOSIS — R7303 Prediabetes: Secondary | ICD-10-CM | POA: Diagnosis not present

## 2018-03-18 DIAGNOSIS — R7309 Other abnormal glucose: Secondary | ICD-10-CM

## 2018-03-18 DIAGNOSIS — I10 Essential (primary) hypertension: Secondary | ICD-10-CM

## 2018-03-18 HISTORY — DX: Unilateral primary osteoarthritis, left knee: M17.12

## 2018-03-18 HISTORY — DX: Impacted cerumen, bilateral: H61.23

## 2018-03-18 LAB — POCT GLYCOSYLATED HEMOGLOBIN (HGB A1C): HBA1C, POC (CONTROLLED DIABETIC RANGE): 5.6 % (ref 0.0–7.0)

## 2018-03-18 MED ORDER — SILDENAFIL CITRATE 20 MG PO TABS: 60.0000 mg | ORAL_TABLET | Freq: Every day | ORAL | 3 refills | Status: DC | PRN

## 2018-03-18 NOTE — Patient Instructions (Addendum)
We injected steroid and lidocaine into your left knee to decrease pain from your arthritis.  If you have increase in pain, warmth, swelling in knee, let our office know immediately.   Keep taking the Tylenol and Ibuprofen as you need for knee pain.  Keep wearing your knee sleeve.   We washed out your ears to remove excess wax.  You do not have diabetes. Your Hemoglobin A1c was 5.6%.  Keep staying physically active.   Knee Injection, Care After Refer to this sheet in the next few weeks. These instructions provide you with information about caring for yourself after your procedure. Your health care provider may also give you more specific instructions. Your treatment has been planned according to current medical practices, but problems sometimes occur. Call your health care provider if you have any problems or questions after your procedure. What can I expect after the procedure? After the procedure, it is common to have:  Soreness.  Warmth.  Swelling.  You may have more pain, swelling, and warmth than you did before the injection. This reaction may last for about one day. Follow these instructions at home: Bathing  If you were given a bandage (dressing), keep it dry until your health care provider says it can be removed. Ask your health care provider when you can start showering or taking a bath. Managing pain, stiffness, and swelling  If directed, apply ice to the injection area: ? Put ice in a plastic bag. ? Place a towel between your skin and the bag. ? Leave the ice on for 20 minutes, 2-3 times per day.  Do not apply heat to your knee.  Raise the injection area above the level of your heart while you are sitting or lying down. Activity  Avoid strenuous activities for as long as directed by your health care provider. Ask your health care provider when you can return to your normal activities. General instructions  Take medicines only as directed by your health care  provider.  Do not take aspirin or other over-the-counter medicines unless your health care provider says you can.  Check your injection site every day for signs of infection. Watch for: ? Redness, swelling, or pain. ? Fluid, blood, or pus.  Follow your health care provider's instructions about dressing changes and removal. Contact a health care provider if:  You have symptoms at your injection site that last longer than two days after your procedure.  You have redness, swelling, or pain in your injection area.  You have fluid, blood, or pus coming from your injection site.  You have warmth in your injection area.  You have a fever.  Your pain is not controlled with medicine. Get help right away if:  Your knee turns very red.  Your knee becomes very swollen.  Your knee pain is severe. This information is not intended to replace advice given to you by your health care provider. Make sure you discuss any questions you have with your health care provider. Document Released: 06/16/2014 Document Revised: 01/30/2016 Document Reviewed: 04/05/2014 Elsevier Interactive Patient Education  [employer].

## 2018-03-19 ENCOUNTER — Encounter: Payer: Self-pay | Admitting: Family Medicine

## 2018-03-19 NOTE — Progress Notes (Signed)
   Subjective:    Patient ID: Samuel Gross, male    DOB: 1950-11-20, 67 y.o.   MRN: [id number] Samuel Gross is alone Sources of clinical information for visit is/are patient and past medical records. Nursing assessment for this office visit was reviewed with the patient for accuracy and revision.  Previous Report(s) Reviewed: historical medical records  Depression screen [facility] 2/9 03/18/2018  Decreased Interest 0  Down, Depressed, Hopeless 0  PHQ - 2 Score 0   Fall Risk  03/18/2018 08/21/2016 07/29/2016 10/24/2015 02/02/2015  Falls in the past year? No No No No No     Adult vaccines due  Topic Date Due  . TETANUS/TDAP  01/31/2025   There are no preventive care reminders to display for this patient. There are no preventive care reminders to display for this patient.   HPI  Problem List Items Addressed This Visit      Medium   TOBACCO USER - Longtime smoking. 20 pack-year.  - Not interested in smoking cessation at this time.    Impotence due to erectile dysfunction - Responseive to viagra 60 mg  - early detumescence.    Relevant Medications   sildenafil (REVATIO) 20 MG tablet    CHRONIC HYPERTENSION  Disease Monitoring  Blood pressure range: at home, SBPs in 120-130s.  Never over 140  Chest pain: no   Dyspnea: no   Claudication: no   Medication compliance: yes  Medication Side Effects  Lightheadedness: no   Urinary frequency: no   Edema: yes    Preventitive Healthcare:  Exercise: yes, physically active but no formal exercise   Diet Pattern: diverse  Salt Restriction: no     Relevant Medications   sildenafil (REVATIO) 20 MG tablet     Low   Osteoarthritis of left knee - Primary (Chronic) - Longstanding pain in both medial parts of botyh knees.  - Takes OTC Tylenol daily and Ibuprofen daily with usually good control - Recent worsening in left knee pain - No swelling or redness in knee - no fever.     Prediabetes -  Blood Sugar Ranges: not checking at  home  Polyuria: no   Visual problems: no          Relevant Orders   HgB A1c (Completed)       SH: (+) smoking   Review of Systems No urinary incontinence No difficulty voiding    Objective:   Physical Exam  VS reviewed GEN: Alert, Cooperative, Groomed, NAD HEENT: PERRL; EAC bilaterally occluded,  No cervical LAN, No thyromegaly COR: RRR, No M LUNGS: BCTA, No Acc mm use, speaking in full sentences ABDOMEN: (+)BS, soft, NT, ND EXT: No peripheral leg edema. Feet without deformity or lesions. Palpable bilateral pedal pulses.  Gait: Normal speed, No significant path deviation, Step through + Psych: Normal affect/thought/speech/language  Procedure Diagnosis: left knee osteoarthrtis Operator: Tharun Cappella Procedure: Corticosteroid injection Consent: yes Site: left anterior knee intraart approach Prep: betadine/alcohol Material: 40 mg Solumedrol and 3 cc 1% Lidocaine Instruments: 1.2 in 21 gauge needle Actions: intraarticular knee injection Complications: none     Assessment & Plan:   Visit Problem List with A/P  No problem-specific Assessment & Plan notes found for this encounter.

## 2018-03-19 NOTE — Assessment & Plan Note (Signed)
Irrigated successfully bilaterally.

## 2018-03-19 NOTE — Assessment & Plan Note (Signed)
S/P left knee depomedrol 40 mg injection with 3 cc lidociane 1%. Continue prn use APAP and IBU

## 2018-03-19 NOTE — Assessment & Plan Note (Signed)
Lab Results  Component Value Date   HGBA1C 5.6 03/18/2018   Established problem that has improved.

## 2018-03-19 NOTE — Assessment & Plan Note (Signed)
Established problem Controlled Continue current therapy regiment.  

## 2018-03-19 NOTE — Assessment & Plan Note (Signed)
Not interested in stopping.  Discussed role of NRT (OTC) He will let us know if he would like assistance w quitting

## 2018-03-19 NOTE — Assessment & Plan Note (Signed)
Adequate glycemic control. Tolerating medications.  No new end-organ damage.  Continue current medications.  

## 2018-09-30 ENCOUNTER — Other Ambulatory Visit: Payer: Self-pay | Admitting: Family Medicine

## 2018-09-30 DIAGNOSIS — I10 Essential (primary) hypertension: Secondary | ICD-10-CM

## 2019-02-03 ENCOUNTER — Other Ambulatory Visit: Payer: Self-pay | Admitting: Family Medicine

## 2019-02-03 DIAGNOSIS — I10 Essential (primary) hypertension: Secondary | ICD-10-CM

## 2019-11-17 ENCOUNTER — Other Ambulatory Visit: Payer: Self-pay

## 2019-11-17 ENCOUNTER — Ambulatory Visit (INDEPENDENT_AMBULATORY_CARE_PROVIDER_SITE_OTHER): Payer: Medicare Other | Admitting: Family Medicine

## 2019-11-17 ENCOUNTER — Encounter: Payer: Self-pay | Admitting: Family Medicine

## 2019-11-17 VITALS — BP 138/60 | HR 80 | Ht 69.0 in | Wt 175.0 lb

## 2019-11-17 DIAGNOSIS — N529 Male erectile dysfunction, unspecified: Secondary | ICD-10-CM

## 2019-11-17 DIAGNOSIS — Z1211 Encounter for screening for malignant neoplasm of colon: Secondary | ICD-10-CM | POA: Diagnosis not present

## 2019-11-17 DIAGNOSIS — R7303 Prediabetes: Secondary | ICD-10-CM | POA: Diagnosis not present

## 2019-11-17 DIAGNOSIS — E7800 Pure hypercholesterolemia, unspecified: Secondary | ICD-10-CM | POA: Diagnosis not present

## 2019-11-17 DIAGNOSIS — F17200 Nicotine dependence, unspecified, uncomplicated: Secondary | ICD-10-CM | POA: Diagnosis not present

## 2019-11-17 DIAGNOSIS — M1711 Unilateral primary osteoarthritis, right knee: Secondary | ICD-10-CM

## 2019-11-17 DIAGNOSIS — I10 Essential (primary) hypertension: Secondary | ICD-10-CM | POA: Diagnosis not present

## 2019-11-17 DIAGNOSIS — Z0000 Encounter for general adult medical examination without abnormal findings: Secondary | ICD-10-CM

## 2019-11-17 LAB — POCT GLYCOSYLATED HEMOGLOBIN (HGB A1C): HbA1c, POC (controlled diabetic range): 5.9 % (ref 0.0–7.0)

## 2019-11-17 MED ORDER — AMLODIPINE BESYLATE 5 MG PO TABS: 5.0000 mg | ORAL_TABLET | Freq: Every day | ORAL | 3 refills | Status: DC

## 2019-11-17 MED ORDER — TADALAFIL 20 MG PO TABS: 10.0000 mg | ORAL_TABLET | ORAL | 11 refills | Status: DC | PRN

## 2019-11-17 NOTE — Patient Instructions (Addendum)
Prediabetes Prediabetes is the condition of having a blood sugar (blood glucose) level that is higher than it should be, but not high enough for you to be diagnosed with type 2 diabetes. Having prediabetes puts you at risk for developing type 2 diabetes (type 2 diabetes mellitus). Prediabetes may be called impaired glucose tolerance or impaired fasting glucose. Prediabetes usually does not cause symptoms. Your health care provider can diagnose this condition with blood tests. You may be tested for prediabetes if you are overweight and if you have at least one other risk factor for prediabetes. What is blood glucose, and how is it measured? Blood glucose refers to the amount of glucose in your bloodstream. Glucose comes from eating foods that contain sugars and starches (carbohydrates), which the body breaks down into glucose. Your blood glucose level may be measured in mg/dL (milligrams per deciliter) or mmol/L (millimoles per liter). Your blood glucose may be checked with one or more of the following blood tests:  A fasting blood glucose (FBG) test. You will not be allowed to eat (you will fast) for 8 hours or longer before a blood sample is taken. ? A normal range for FBG is 70-100 mg/dl (3.9-5.6 mmol/L).  An A1c (hemoglobin A1c) blood test. This test provides information about blood glucose control over the previous 2?3months.  An oral glucose tolerance test (OGTT). This test measures your blood glucose at two times: ? After fasting. This is your baseline level. ? Two hours after you drink a beverage that contains glucose. You may be diagnosed with prediabetes:  If your FBG is 100?125 mg/dL (5.6-6.9 mmol/L).  If your A1c level is 5.7?6.4%.  If your OGTT result is 140?199 mg/dL (7.8-11 mmol/L). These blood tests may be repeated to confirm your diagnosis. How can this condition affect me? The pancreas produces a hormone (insulin) that helps to move glucose from the bloodstream into cells.  When cells in the body do not respond properly to insulin that the body makes (insulin resistance), excess glucose builds up in the blood instead of going into cells. As a result, high blood glucose (hyperglycemia) can develop, which can cause many complications. Hyperglycemia is a symptom of prediabetes. Having high blood glucose for a long time is dangerous. Too much glucose in your blood can damage your nerves and blood vessels. Long-term damage can lead to complications from diabetes, which may include:  Heart disease.  Stroke.  Blindness.  Kidney disease.  Depression.  Poor circulation in the feet and legs, which could lead to surgical removal (amputation) in severe cases. What can increase my risk? Risk factors for prediabetes include:  Having a family member with type 2 diabetes.  Being overweight or obese.  Being older than age 45.  Being of American Indian, African-American, Hispanic/Latino, or Asian/Pacific Islander descent.  Having an inactive (sedentary) lifestyle.  Having a history of heart disease.  History of gestational diabetes or polycystic ovary syndrome (PCOS), in women.  Having low levels of good cholesterol (HDL-C) or high levels of blood fats (triglycerides).  Having high blood pressure. What actions can I take to prevent diabetes?      Be physically active. ? Do moderate-intensity physical activity for 30 or more minutes on 5 or more days of the week, or as much as told by your health care provider. This could be brisk walking, biking, or water aerobics. ? Ask your health care provider what activities are safe for you. A mix of physical activities may be best, such as   walking, swimming, cycling, and strength training.  Lose weight as told by your health care provider. ? Losing 5-7% of your body weight can reverse insulin resistance. ? Your health care provider can determine how much weight loss is best for you and can help you lose weight  safely.  Follow a healthy meal plan. This includes eating lean proteins, complex carbohydrates, fresh fruits and vegetables, low-fat dairy products, and healthy fats. ? Follow instructions from your health care provider about eating or drinking restrictions. ? Make an appointment to see a diet and nutrition specialist (registered dietitian) to help you create a healthy eating plan that is right for you.  Do not smoke or use any tobacco products, such as cigarettes, chewing tobacco, and e-cigarettes. If you need help quitting, ask your health care provider.  Take over-the-counter and prescription medicines as told by your health care provider. You may be prescribed medicines that help lower the risk of type 2 diabetes.  Keep all follow-up visits as told by your health care provider. This is important. Summary  Prediabetes is the condition of having a blood sugar (blood glucose) level that is higher than it should be, but not high enough for you to be diagnosed with type 2 diabetes.  Having prediabetes puts you at risk for developing type 2 diabetes (type 2 diabetes mellitus).  To help prevent type 2 diabetes, make lifestyle changes such as being physically active and eating a healthy diet. Lose weight as told by your health care provider. This information is not intended to replace advice given to you by your health care provider. Make sure you discuss any questions you have with your health care provider. Document Revised: 09/17/2018 Document Reviewed: 07/17/2015 Elsevier Patient Education  2020 [employer].   Prediabetes Eating Plan Prediabetes is a condition that causes blood sugar (glucose) levels to be higher than normal. This increases the risk for developing diabetes. In order to prevent diabetes from developing, your health care provider may recommend a diet and other lifestyle changes to help you:  Control your blood glucose levels.  Improve your cholesterol levels.  Manage  your blood pressure. Your health care provider may recommend working with a diet and nutrition specialist (dietitian) to make a meal plan that is best for you. What are tips for following this plan? Lifestyle  Set weight loss goals with the help of your health care team. It is recommended that most people with prediabetes lose 7% of their current body weight.  Exercise for at least 30 minutes at least 5 days a week.  Attend a support group or seek ongoing support from a mental health counselor.  Take over-the-counter and prescription medicines only as told by your health care provider. Reading food labels  Read food labels to check the amount of fat, salt (sodium), and sugar in prepackaged foods. Avoid foods that have: ? Saturated fats. ? Trans fats. ? Added sugars.  Avoid foods that have more than 300 milligrams (mg) of sodium per serving. Limit your daily sodium intake to less than 2,300 mg each day. Shopping  Avoid buying pre-made and processed foods. Cooking  Cook with olive oil. Do not use butter, lard, or ghee.  Bake, broil, grill, or boil foods. Avoid frying. Meal planning   Work with your dietitian to develop an eating plan that is right for you. This may include: ? Tracking how many calories you take in. Use a food diary, notebook, or mobile application to track what you eat at  each meal. ? Using the glycemic index (GI) to plan your meals. The index tells you how quickly a food will raise your blood glucose. Choose low-GI foods. These foods take a longer time to raise blood glucose.  Consider following a Mediterranean diet. This diet includes: ? Several servings each day of fresh fruits and vegetables. ? Eating fish at least twice a week. ? Several servings each day of whole grains, beans, nuts, and seeds. ? Using olive oil instead of other fats. ? Moderate alcohol consumption. ? Eating small amounts of red meat and whole-fat dairy.  If you have high blood  pressure, you may need to limit your sodium intake or follow a diet such as the DASH eating plan. DASH is an eating plan that aims to lower high blood pressure. What foods are recommended? The items listed below may not be a complete list. Talk with your dietitian about what dietary choices are best for you. Grains Whole grains, such as whole-wheat or whole-grain breads, crackers, cereals, and pasta. Unsweetened oatmeal. Bulgur. Barley. Quinoa. Brown rice. Corn or whole-wheat flour tortillas or taco shells. Vegetables Lettuce. Spinach. Peas. Beets. Cauliflower. Cabbage. Broccoli. Carrots. Tomatoes. Squash. Eggplant. Herbs. Peppers. Onions. Cucumbers. Brussels sprouts. Fruits Berries. Bananas. Apples. Oranges. Grapes. Papaya. Mango. Pomegranate. Kiwi. Grapefruit. Cherries. Meats and other protein foods Seafood. Poultry without skin. Lean cuts of pork and beef. Tofu. Eggs. Nuts. Beans. Dairy Low-fat or fat-free dairy products, such as yogurt, cottage cheese, and cheese. Beverages Water. Tea. Coffee. Sugar-free or diet soda. Seltzer water. Lowfat or no-fat milk. Milk alternatives, such as soy or almond milk. Fats and oils Olive oil. Canola oil. Sunflower oil. Grapeseed oil. Avocado. Walnuts. Sweets and desserts Sugar-free or low-fat pudding. Sugar-free or low-fat ice cream and other frozen treats. Seasoning and other foods Herbs. Sodium-free spices. Mustard. Relish. Low-fat, low-sugar ketchup. Low-fat, low-sugar barbecue sauce. Low-fat or fat-free mayonnaise. What foods are not recommended? The items listed below may not be a complete list. Talk with your dietitian about what dietary choices are best for you. Grains Refined white flour and flour products, such as bread, pasta, snack foods, and cereals. Vegetables Canned vegetables. Frozen vegetables with butter or cream sauce. Fruits Fruits canned with syrup. Meats and other protein foods Fatty cuts of meat. Poultry with skin. Breaded or  fried meat. Processed meats. Dairy Full-fat yogurt, cheese, or milk. Beverages Sweetened drinks, such as sweet iced tea and soda. Fats and oils Butter. Lard. Ghee. Sweets and desserts Baked goods, such as cake, cupcakes, pastries, cookies, and cheesecake. Seasoning and other foods Spice mixes with added salt. Ketchup. Barbecue sauce. Mayonnaise. Summary  To prevent diabetes from developing, you may need to make diet and other lifestyle changes to help control blood sugar, improve cholesterol levels, and manage your blood pressure.  Set weight loss goals with the help of your health care team. It is recommended that most people with prediabetes lose 7 percent of their current body weight.  Consider following a Mediterranean diet that includes plenty of fresh fruits and vegetables, whole grains, beans, nuts, seeds, fish, lean meat, low-fat dairy, and healthy oils. This information is not intended to replace advice given to you by your health care provider. Make sure you discuss any questions you have with your health care provider. Document Revised: 09/17/2018 Document Reviewed: 07/30/2016 Elsevier Patient Education  [place] blood pressure looks good.  Keep taking your amlodipine.   We are checking your cholesterol today.  If it is high,  Dr Camdin Hegner may recommend starting a medication to lower your cholesterol and lower your risk of heart attack and strokes.  \  A referral to Dr Havery Moros, [facility], for colon cancer screening was made.  If you do not hear from his office within a week, please let Dr Venise Ellingwood's office know.     To get free help stopping smoking call 1- 800-QUIT-NOW.

## 2019-11-18 ENCOUNTER — Encounter: Payer: Self-pay | Admitting: Family Medicine

## 2019-11-18 ENCOUNTER — Encounter: Payer: Self-pay | Admitting: Gastroenterology

## 2019-11-18 ENCOUNTER — Telehealth: Payer: Self-pay | Admitting: Family Medicine

## 2019-11-18 DIAGNOSIS — Z0000 Encounter for general adult medical examination without abnormal findings: Secondary | ICD-10-CM | POA: Insufficient documentation

## 2019-11-18 LAB — BASIC METABOLIC PANEL
BUN/Creatinine Ratio: 10 (ref 10–24)
BUN: 15 mg/dL (ref 8–27)
CO2: 22 mmol/L (ref 20–29)
Calcium: 9.5 mg/dL (ref 8.6–10.2)
Chloride: 103 mmol/L (ref 96–106)
Creatinine, Ser: 1.46 mg/dL — ABNORMAL HIGH (ref 0.76–1.27)
GFR calc Af Amer: 56 mL/min/{1.73_m2} — ABNORMAL LOW (ref >59)
GFR calc non Af Amer: 49 mL/min/{1.73_m2} — ABNORMAL LOW (ref >59)
Glucose: 104 mg/dL — ABNORMAL HIGH (ref 65–99)
Potassium: 4.9 mmol/L (ref 3.5–5.2)
Sodium: 140 mmol/L (ref 134–144)

## 2019-11-18 LAB — LIPID PANEL
Chol/HDL Ratio: 5.2 ratio — ABNORMAL HIGH (ref 0.0–5.0)
Cholesterol, Total: 245 mg/dL — ABNORMAL HIGH (ref 100–199)
HDL: 47 mg/dL (ref >39)
LDL Chol Calc (NIH): 186 mg/dL — ABNORMAL HIGH (ref 0–99)
Triglycerides: 69 mg/dL (ref 0–149)
VLDL Cholesterol Cal: 12 mg/dL (ref 5–40)

## 2019-11-18 MED ORDER — ROSUVASTATIN CALCIUM 40 MG PO TABS: 40.0000 mg | ORAL_TABLET | Freq: Every day | ORAL | 3 refills | Status: DC

## 2019-11-18 NOTE — Assessment & Plan Note (Signed)
Established problem. Adequate blood pressure control.  No evidence of new end organ damage.  Tolerating medication without significant adverse effects.  Plan to continue current blood pressure medication regiment.   

## 2019-11-18 NOTE — Telephone Encounter (Signed)
Patient returns call to nurse line regarding missed call from PCP.   Forwarding to Dr. Wendy Poet  Talbot Grumbling, RN

## 2019-11-18 NOTE — Assessment & Plan Note (Signed)
Lipid Panel     Component Value Date/Time   CHOL 245 (H) 11/17/2019 1029   TRIG 69 11/17/2019 1029   HDL 47 11/17/2019 1029   CHOLHDL 5.2 (H) 11/17/2019 1029   CHOLHDL 4.3 02/01/2015 1126   VLDL 14 02/01/2015 1126   LDLCALC 186 (H) 11/17/2019 1029   LDLDIRECT 137.2 05/08/2011 1512   LABVLDL 12 11/17/2019 1029   Established problem that is uncontrolled.  Recommend start of high potency statin, rosuvastatin 40 mg daily.

## 2019-11-18 NOTE — Telephone Encounter (Signed)
Patient calls nurse line returning PCP call. Per chart review, patient has an elevated lipid panel. Patient advise of this and the need to start Crestor. Patient informed this has been sent to his mail order pharmacy, along with a refill on blood pressure medication. Patient advised on exercise and diet changes, along with taking Crestor daily.

## 2019-11-18 NOTE — Assessment & Plan Note (Signed)
Rediscovery of prediabetes.  First noted in 2016 on an elevated A1c 5.9%. Discussed role of exercise and diet.  He has been walking at HS track 1/2 to 1 &1/2 miles most days. Using measured bowels to control portion size.

## 2019-11-18 NOTE — Assessment & Plan Note (Signed)
Established problem that has improved.  Taking frequent ibuprofen. May have role in SCr rise.  Discuss reduction in NSAID use

## 2019-11-18 NOTE — Assessment & Plan Note (Signed)
Contemplative phase of 5 stages of change Offered our services Given 1-800 QUIT NOW information

## 2019-11-18 NOTE — Assessment & Plan Note (Signed)
Mr Zagal agreed to referral to S. Armbruster MD (GI) for CRC screening.  He declined AAA screening with [place]  He reports completing his Covid-19 virus vaccination series back in March

## 2019-11-21 NOTE — Progress Notes (Signed)
Visit Problem List with A/P  Essential hypertension Established problem. Adequate blood pressure control.  No evidence of new end organ damage.  Tolerating medication without significant adverse effects.  Plan to continue current blood pressure medication regiment.    Pure hypercholesterolemia Lipid Panel     Component Value Date/Time   CHOL 245 (H) 11/17/2019 1029   TRIG 69 11/17/2019 1029   HDL 47 11/17/2019 1029   CHOLHDL 5.2 (H) 11/17/2019 1029   CHOLHDL 4.3 02/01/2015 1126   VLDL 14 02/01/2015 1126   LDLCALC 186 (H) 11/17/2019 1029   LDLDIRECT 137.2 05/08/2011 1512   LABVLDL 12 11/17/2019 1029   Established problem that is uncontrolled.  Recommend start of high potency statin, rosuvastatin 40 mg daily.    TOBACCO USER Contemplative phase of 5 stages of change Offered our services Given 1-800 QUIT NOW information  Osteoarthritis of right knee Established problem that has improved.  Taking frequent ibuprofen. May have role in SCr rise.  Discuss reduction in NSAID use   Prediabetes Rediscovery of prediabetes.  First noted in 2016 on an elevated A1c 5.9%. Discussed role of exercise and diet.  He has been walking at HS track 1/2 to 1 &1/2 miles most days. Using measured bowels to control portion size.   Healthcare maintenance Mr Montour agreed to referral to S. Armbruster MD (GI) for CRC screening.  He declined AAA screening with [place]  He reports completing his Covid-19 virus vaccination series back in March

## 2019-12-28 ENCOUNTER — Ambulatory Visit (AMBULATORY_SURGERY_CENTER): Payer: Self-pay

## 2019-12-28 ENCOUNTER — Encounter: Payer: Self-pay | Admitting: Gastroenterology

## 2019-12-28 ENCOUNTER — Other Ambulatory Visit: Payer: Self-pay

## 2019-12-28 VITALS — Ht 69.0 in | Wt 166.8 lb

## 2019-12-28 DIAGNOSIS — Z1211 Encounter for screening for malignant neoplasm of colon: Secondary | ICD-10-CM

## 2019-12-28 MED ORDER — SUTAB 1479-225-188 MG PO TABS: 1.0000 | ORAL_TABLET | ORAL | 0 refills | Status: DC

## 2019-12-28 NOTE — Progress Notes (Signed)
No egg or soy allergy known to patient  No issues with past sedation with any surgeries or procedures No intubation problems in the past  No diet pills per patient No home 02 use per patient  No blood thinners per patient  Pt denies issues with constipation  No A fib or A flutter   COVID 19 guidelines implemented in [facility] today   Coupon given to pt in [facility] today  COVID vaccines completed on 09/2019;  Due to the COVID-19 pandemic we are asking patients to follow these guidelines. Please only bring one care partner. Please be aware that your care partner may wait in the car in the parking lot or if they feel like they will be too hot to wait in the car, they may wait in the lobby on the 4th floor. All care partners are required to wear a mask the entire time (we do not have any that we can provide them), they need to practice social distancing, and we will do a Covid check for all patient's and care partners when you arrive. Also we will check their temperature and your temperature. If the care partner waits in their car they need to stay in the parking lot the entire time and we will call them on their cell phone when the patient is ready for discharge so they can bring the car to the front of the building. Also all patient's will need to wear a mask into building.

## 2020-01-11 ENCOUNTER — Ambulatory Visit (AMBULATORY_SURGERY_CENTER): Payer: Medicare Other | Admitting: Gastroenterology

## 2020-01-11 ENCOUNTER — Encounter: Payer: Self-pay | Admitting: Family Medicine

## 2020-01-11 ENCOUNTER — Encounter: Payer: Self-pay | Admitting: Gastroenterology

## 2020-01-11 ENCOUNTER — Other Ambulatory Visit: Payer: Self-pay

## 2020-01-11 VITALS — BP 140/74 | HR 73 | Temp 98.4°F | Resp 22 | Ht 69.0 in | Wt 166.8 lb

## 2020-01-11 DIAGNOSIS — D124 Benign neoplasm of descending colon: Secondary | ICD-10-CM

## 2020-01-11 DIAGNOSIS — D126 Benign neoplasm of colon, unspecified: Secondary | ICD-10-CM

## 2020-01-11 DIAGNOSIS — D123 Benign neoplasm of transverse colon: Secondary | ICD-10-CM | POA: Diagnosis not present

## 2020-01-11 DIAGNOSIS — Z1211 Encounter for screening for malignant neoplasm of colon: Secondary | ICD-10-CM | POA: Diagnosis not present

## 2020-01-11 DIAGNOSIS — D128 Benign neoplasm of rectum: Secondary | ICD-10-CM

## 2020-01-11 DIAGNOSIS — Z860100 Personal history of colon polyps, unspecified: Secondary | ICD-10-CM

## 2020-01-11 DIAGNOSIS — D122 Benign neoplasm of ascending colon: Secondary | ICD-10-CM

## 2020-01-11 HISTORY — DX: Personal history of colonic polyps: Z86.010

## 2020-01-11 HISTORY — DX: Personal history of colon polyps, unspecified: Z86.0100

## 2020-01-11 MED ORDER — SODIUM CHLORIDE 0.9 % IV SOLN: 500.0000 mL | Freq: Once | INTRAVENOUS | Status: DC

## 2020-01-11 NOTE — Progress Notes (Signed)
Called to room to assist during endoscopic procedure.  Patient ID and intended procedure confirmed with present staff. Received instructions for my participation in the procedure from the performing physician.  

## 2020-01-11 NOTE — Progress Notes (Signed)
0904 pt noted to have clear fluid coming out of his mouth.  Suction immediately started but very little obtained in cannister.  Pillow had wet area but not completely soaked.  Sedation of course was halted. No apparent aspiration

## 2020-01-11 NOTE — Patient Instructions (Signed)
Handouts on polyps and diverticulosis given to you today  Await pathology results from Dr. Havery Moros    YOU HAD AN ENDOSCOPIC PROCEDURE TODAY AT THE [facility]:   Refer to the procedure report that was given to you for any specific questions about what was found during the examination.  If the procedure report does not answer your questions, please call your gastroenterologist to clarify.  If you requested that your care partner not be given the details of your procedure findings, then the procedure report has been included in a sealed envelope for you to review at your convenience later.  YOU SHOULD EXPECT: Some feelings of bloating in the abdomen. Passage of more gas than usual.  Walking can help get rid of the air that was put into your GI tract during the procedure and reduce the bloating. If you had a lower endoscopy (such as a colonoscopy or flexible sigmoidoscopy) you may notice spotting of blood in your stool or on the toilet paper. If you underwent a bowel prep for your procedure, you may not have a normal bowel movement for a few days.  Please Note:  You might notice some irritation and congestion in your nose or some drainage.  This is from the oxygen used during your procedure.  There is no need for concern and it should clear up in a day or so.  SYMPTOMS TO REPORT IMMEDIATELY:   Following lower endoscopy (colonoscopy or flexible sigmoidoscopy):  Excessive amounts of blood in the stool  Significant tenderness or worsening of abdominal pains  Swelling of the abdomen that is new, acute  Fever of 100F or higher  For urgent or emergent issues, a gastroenterologist can be reached at any hour by calling [phone]. Do not use MyChart messaging for urgent concerns.    DIET:  We do recommend a small meal at first, but then you may proceed to your regular diet.  Drink plenty of fluids but you should avoid alcoholic beverages for 24 hours.  ACTIVITY:  You should plan to  take it easy for the rest of today and you should NOT DRIVE or use heavy machinery until tomorrow (because of the sedation medicines used during the test).    FOLLOW UP: Our staff will call the number listed on your records 48-72 hours following your procedure to check on you and address any questions or concerns that you may have regarding the information given to you following your procedure. If we do not reach you, we will leave a message.  We will attempt to reach you two times.  During this call, we will ask if you have developed any symptoms of COVID 19. If you develop any symptoms (ie: fever, flu-like symptoms, shortness of breath, cough etc.) before then, please call [phone].  If you test positive for Covid 19 in the 2 weeks post procedure, please call and report this information to [place].    If any biopsies were taken you will be contacted by phone or by letter within the next 1-3 weeks.  Please call us at [phone] if you have not heard about the biopsies in 3 weeks.    SIGNATURES/CONFIDENTIALITY: You and/or your care partner have signed paperwork which will be entered into your electronic medical record.  These signatures attest to the fact that that the information above on your After Visit Summary has been reviewed and is understood.  Full responsibility of the confidentiality of this discharge information lies with you and/or your care-partner.

## 2020-01-11 NOTE — Progress Notes (Signed)
Report to PACU, RN, vss, BBS= Clear.  

## 2020-01-11 NOTE — Progress Notes (Signed)
VS-CW  Pt's states no medical or surgical changes since previsit or office visit.  

## 2020-01-11 NOTE — Op Note (Addendum)
[place] Patient Name: Samuel Gross Procedure Date: 01/11/2020 8:11 AM MRN: [id number] Endoscopist: Remo Lipps P. Havery Moros , MD Age: 69 Referring MD:  Date of Birth: July 14, 1950 Gender: Male Account #: [id number] Procedure:                Colonoscopy Indications:              Screening for colorectal malignant neoplasm, This                            is the patient's first colonoscopy Medicines:                Monitored Anesthesia Care Procedure:                Pre-Anesthesia Assessment:                           - Prior to the procedure, a History and Physical                            was performed, and patient medications and                            allergies were reviewed. The patient's tolerance of                            previous anesthesia was also reviewed. The risks                            and benefits of the procedure and the sedation                            options and risks were discussed with the patient.                            All questions were answered, and informed consent                            was obtained. Prior Anticoagulants: The patient has                            taken no previous anticoagulant or antiplatelet                            agents. ASA Grade Assessment: II - A patient with                            mild systemic disease. After reviewing the risks                            and benefits, the patient was deemed in                            satisfactory condition to undergo the procedure.  After obtaining informed consent, the colonoscope                            was passed under direct vision. Throughout the                            procedure, the patient's blood pressure, pulse, and                            oxygen saturations were monitored continuously. The                            Colonoscope was introduced through the anus and                            advanced to the the  cecum, identified by                            appendiceal orifice and ileocecal valve. The                            colonoscopy was performed without difficulty. The                            patient tolerated the procedure well. The quality                            of the bowel preparation was good. The ileocecal                            valve, appendiceal orifice, and rectum were                            photographed. Scope In: 8:37:52 AM Scope Out: 9:08:30 AM Scope Withdrawal Time: 0 hours 24 minutes 22 seconds  Total Procedure Duration: 0 hours 30 minutes 38 seconds  Findings:                 The perianal and digital rectal examinations were                            normal.                           The terminal ileum appeared normal.                           A 4 mm polyp was found in the ascending colon. The                            polyp was flat. The polyp was removed with a cold                            snare. Resection and retrieval were complete.  Two sessile polyps were found in the hepatic                            flexure. The polyps were 3 to 4 mm in size. These                            polyps were removed with a cold snare. Resection                            and retrieval were complete.                           Two sessile polyps were found in the hepatic                            flexure. The polyps were 2 to 3 mm in size. These                            polyps were removed with a cold biopsy forceps                            (could not grasp with snare). Resection and                            retrieval were complete.                           Three sessile polyps were found in the transverse                            colon. The polyps were 3 to 5 mm in size. These                            polyps were removed with a cold snare. Resection                            and retrieval were complete.                            Two sessile polyps were found in the descending                            colon. The polyps were 3 to 4 mm in size. These                            polyps were removed with a cold snare. Resection                            and retrieval were complete.                           Three flat and sessile polyps were found in the  rectum. The polyps were 3 to 5 mm in size. These                            polyps were removed with a cold snare. Resection                            and retrieval were complete.                           Multiple small-mouthed diverticula were found in                            the left colon and right colon.                           Internal hemorrhoids were found during                            retroflexion. The hemorrhoids were small.                           The exam was otherwise without abnormality. Complications:            No immediate complications. Estimated blood loss:                            Minimal. Estimated Blood Loss:     Estimated blood loss was minimal. Impression:               - One 4 mm polyp in the ascending colon, removed                            with a cold snare. Resected and retrieved.                           - Two 3 to 4 mm polyps at the hepatic flexure,                            removed with a cold snare. Resected and retrieved.                           - Two 2 to 3 mm polyps at the hepatic flexure,                            removed with a cold biopsy forceps. Resected and                            retrieved.                           - Three 3 to 5 mm polyps in the transverse colon,                            removed with a cold snare. Resected and retrieved.                           -  Two 3 to 4 mm polyps in the descending colon,                            removed with a cold snare. Resected and retrieved.                           - Three 3 to 5 mm polyps in the rectum, removed                             with a cold snare. Resected and retrieved.                           - Diverticulosis in the left colon and in the right                            colon.                           - Internal hemorrhoids.                           - The examination was otherwise normal. Recommendation:           - Patient has a contact number available for                            emergencies. The signs and symptoms of potential                            delayed complications were discussed with the                            patient. Return to normal activities tomorrow.                            Written discharge instructions were provided to the                            patient.                           - Resume previous diet.                           - Continue present medications.                           - Await pathology results. Remo Lipps P. Tillmon Kisling, MD 01/11/2020 9:18:16 AM This report has been signed electronically.

## 2020-01-13 ENCOUNTER — Telehealth: Payer: Self-pay | Admitting: *Deleted

## 2020-01-13 ENCOUNTER — Encounter: Payer: Self-pay | Admitting: Gastroenterology

## 2020-01-13 NOTE — Telephone Encounter (Signed)
No answer for post procedure call back. Left message for patient and will callback later today. 

## 2020-01-13 NOTE — Telephone Encounter (Signed)
  Follow up Call-  Call back number 01/11/2020  Post procedure Call Back phone  # [phone]  Permission to leave phone message Yes  Some recent data might be hidden    Spoke with SO Patient questions:  Do you have a fever, pain , or abdominal swelling? No. Pain Score  0 *  Have you tolerated food without any problems? Yes.    Have you been able to return to your normal activities? Yes.    Do you have any questions about your discharge instructions: Diet   No. Medications   NO Follow up visit  No.  Do you have questions or concerns about your Care? No.  Actions: * If pain score is 4 or above: No action needed, pain <4.  1. Have you developed a fever since your procedure? no  2.   Have you had an respiratory symptoms (SOB or cough) since your procedure? no  3.   Have you tested positive for COVID 19 since your procedure no  4.   Have you had any family members/close contacts diagnosed with the COVID 19 since your procedure?  no   If yes to any of these questions please route to Joylene John, RN and Erenest Rasher, RN

## 2020-09-04 ENCOUNTER — Other Ambulatory Visit: Payer: Self-pay | Admitting: Family Medicine

## 2020-09-04 DIAGNOSIS — E7800 Pure hypercholesterolemia, unspecified: Secondary | ICD-10-CM

## 2020-09-04 DIAGNOSIS — I10 Essential (primary) hypertension: Secondary | ICD-10-CM

## 2021-02-19 ENCOUNTER — Other Ambulatory Visit: Payer: Self-pay | Admitting: Family Medicine

## 2021-02-19 DIAGNOSIS — I10 Essential (primary) hypertension: Secondary | ICD-10-CM

## 2021-02-19 DIAGNOSIS — E7800 Pure hypercholesterolemia, unspecified: Secondary | ICD-10-CM

## 2021-10-29 ENCOUNTER — Encounter: Payer: Self-pay | Admitting: Family Medicine

## 2021-10-29 DIAGNOSIS — F17210 Nicotine dependence, cigarettes, uncomplicated: Secondary | ICD-10-CM | POA: Insufficient documentation

## 2021-10-29 NOTE — Patient Instructions (Incomplete)
    SHINGLES VACCINATION  Chickenpox and shingles are related because they are caused by the same virus (varicella-zoster virus). After a person recovers from chickenpox, the virus stays dormant (inactive) in the body. It can reactivate years later and cause shingles.  CDC recommends that adults 50 years and older get two doses of the shingles vaccine called Shingrix (recombinant zoster vaccine) to prevent shingles and the complications from the disease.  Shingrix vaccination provides strong protection against shingles. In adults 50 years Shingrix is more than 90% effective at preventing shingles.  Studies show that Shingrix is safe. The vaccine helps your body create a strong defense against shingles. As a result, you are likely to have temporary side effects from getting the shots. Some people feel they are having a mild flu without the head cold symptoms.  The side effects might affect your ability to do normal daily activities for 2 to 3 days.  Getting the shot when you have a couple days without commitments is a good idea, in case you do feel under the weather.  Medicare prescription drug plans (Part D) usually cover all available vaccines needed to prevent illness, like the shingles (Shingrix) shot. Contact your Medicare drug plan If you are uncertain if they will cover the shingles vaccination.   Your pharmacist can give you Shingrix as a shot in your upper arm. You will need two Shingrix vaccinations to complete your inoculation against shingles.  The second injection is given at least 2 months after the first Shingrix vaccination was given.   

## 2021-10-31 ENCOUNTER — Other Ambulatory Visit: Payer: Self-pay | Admitting: Family Medicine

## 2021-10-31 ENCOUNTER — Ambulatory Visit (INDEPENDENT_AMBULATORY_CARE_PROVIDER_SITE_OTHER): Payer: Medicare Other | Admitting: Family Medicine

## 2021-10-31 ENCOUNTER — Encounter: Payer: Self-pay | Admitting: Family Medicine

## 2021-10-31 VITALS — BP 187/79 | HR 73 | Ht 69.0 in | Wt 175.2 lb

## 2021-10-31 DIAGNOSIS — M1711 Unilateral primary osteoarthritis, right knee: Secondary | ICD-10-CM | POA: Diagnosis not present

## 2021-10-31 DIAGNOSIS — Z23 Encounter for immunization: Secondary | ICD-10-CM

## 2021-10-31 DIAGNOSIS — F17210 Nicotine dependence, cigarettes, uncomplicated: Secondary | ICD-10-CM

## 2021-10-31 DIAGNOSIS — E7800 Pure hypercholesterolemia, unspecified: Secondary | ICD-10-CM

## 2021-10-31 DIAGNOSIS — Z860100 Personal history of colon polyps, unspecified: Secondary | ICD-10-CM

## 2021-10-31 DIAGNOSIS — R7989 Other specified abnormal findings of blood chemistry: Secondary | ICD-10-CM | POA: Diagnosis not present

## 2021-10-31 DIAGNOSIS — I10 Essential (primary) hypertension: Secondary | ICD-10-CM

## 2021-10-31 DIAGNOSIS — F17200 Nicotine dependence, unspecified, uncomplicated: Secondary | ICD-10-CM

## 2021-11-01 ENCOUNTER — Encounter: Payer: Self-pay | Admitting: Family Medicine

## 2021-11-01 DIAGNOSIS — R7989 Other specified abnormal findings of blood chemistry: Secondary | ICD-10-CM | POA: Insufficient documentation

## 2021-11-01 LAB — LIPID PANEL
Chol/HDL Ratio: 2.8 ratio (ref 0.0–5.0)
Cholesterol, Total: 142 mg/dL (ref 100–199)
HDL: 50 mg/dL (ref >39)
LDL Chol Calc (NIH): 78 mg/dL (ref 0–99)
Triglycerides: 73 mg/dL (ref 0–149)
VLDL Cholesterol Cal: 14 mg/dL (ref 5–40)

## 2021-11-01 LAB — RENAL FUNCTION PANEL
Albumin: 4.7 g/dL (ref 3.8–4.8)
BUN/Creatinine Ratio: 8 — ABNORMAL LOW (ref 10–24)
BUN: 9 mg/dL (ref 8–27)
CO2: 22 mmol/L (ref 20–29)
Calcium: 9.5 mg/dL (ref 8.6–10.2)
Chloride: 103 mmol/L (ref 96–106)
Creatinine, Ser: 1.15 mg/dL (ref 0.76–1.27)
Glucose: 103 mg/dL — ABNORMAL HIGH (ref 70–99)
Phosphorus: 2.8 mg/dL (ref 2.8–4.1)
Potassium: 4.5 mmol/L (ref 3.5–5.2)
Sodium: 141 mmol/L (ref 134–144)
eGFR: 68 mL/min/{1.73_m2} (ref >59)

## 2021-11-01 MED ORDER — NICOTINE POLACRILEX 4 MG MT LOZG: 4.0000 mg | LOZENGE | OROMUCOSAL | 0 refills | Status: DC | PRN

## 2021-11-01 MED ORDER — DICLOFENAC SODIUM 1 % EX GEL: 4.0000 g | Freq: Four times a day (QID) | CUTANEOUS | 3 refills | Status: AC

## 2021-11-01 MED ORDER — DICLOFENAC SODIUM 1 % EX GEL: 4.0000 g | Freq: Four times a day (QID) | CUTANEOUS | Status: DC

## 2021-11-01 NOTE — Assessment & Plan Note (Signed)
Established problem Lab Results  Component Value Date   CREATININE 1.15 10/31/2021   CREATININE 1.46 (H) 11/17/2019   CREATININE 1.08 07/17/2016   serum creatinine impoved Recommend avoiding NSAIDs

## 2021-11-01 NOTE — Progress Notes (Signed)
Samuel Gross is alone Sources of clinical information for visit is/are patient. Nursing assessment for this office visit was reviewed with the patient for accuracy and revision.     Previous Report(s) Reviewed: Dr Doyne Keel colonoscopy in 2021 and his repeat colonoscopy recommendations.      10/31/2021    8:41 AM  Depression screen PHQ 2/9  Decreased Interest 0  Down, Depressed, Hopeless 0  PHQ - 2 Score 0  Altered sleeping 0  Tired, decreased energy 0  Change in appetite 0  Feeling bad or failure about yourself  0  Trouble concentrating 0  Moving slowly or fidgety/restless 0  Suicidal thoughts 0  PHQ-9 Score 0  Difficult doing work/chores Not difficult at all   [facility] Visit from 10/31/2021 in [place]  Thoughts that you would be better off dead, or of hurting yourself in some way Not at all  PHQ-9 Total Score 0          10/31/2021    8:41 AM 11/17/2019    8:32 AM 03/18/2018   10:01 AM 08/21/2016    9:31 AM 07/29/2016   10:23 AM  Fall Risk   Falls in the past year? 0 0 No No No  Number falls in past yr: 0 0     Injury with Fall? 0 0          10/31/2021    8:41 AM 11/17/2019    8:32 AM 03/18/2018   10:01 AM  PHQ9 SCORE ONLY  PHQ-9 Total Score 0 0 0    Adult vaccines due  Topic Date Due   TETANUS/TDAP  01/31/2025    Health Maintenance Due  Topic Date Due   Zoster Vaccines- Shingrix (2 of 2) 03/29/2015   COLONOSCOPY (Pts 45-28yr Insurance coverage will need to be confirmed)  01/10/2021      History/P.E. limitations: none  Adult vaccines due  Topic Date Due   TETANUS/TDAP  01/31/2025   There are no preventive care reminders to display for this patient.  Health Maintenance Due  Topic Date Due   Zoster Vaccines- Shingrix (2 of 2) 03/29/2015   COLONOSCOPY (Pts 45-464yrInsurance coverage will need to be confirmed)  01/10/2021     Chief Complaint  Patient presents with   3 mo check up

## 2021-11-01 NOTE — Assessment & Plan Note (Addendum)
Established problem and controlled.  Primary prevention No findings c/w new end organ damage Reports taking rosuvastatin 40 mg daily without adverse effects Lipid Panel     Component Value Date/Time   CHOL 142 10/31/2021 1049   TRIG 73 10/31/2021 1049   HDL 50 10/31/2021 1049   CHOLHDL 2.8 10/31/2021 1049   CHOLHDL 4.3 02/01/2015 1126   VLDL 14 02/01/2015 1126   LDLCALC 78 10/31/2021 1049   LDLDIRECT 137.2 05/08/2011 1512   LABVLDL 14 10/31/2021 1049

## 2021-11-01 NOTE — Assessment & Plan Note (Signed)
Established problem No symptoms or findings of new end organ damage. Checking serum creatinine today Uncontrolled.  Patient is not at goal of <140/90. Increase Amlodipine to 10 mg daily RTC 4 weeks to check reponse

## 2021-11-01 NOTE — Assessment & Plan Note (Signed)
Established problem Uncontrolled.  Patient is not at goal of significant decrease in pain and use of NSAIDs. He is able to perform iADLs and ADLs Start: Voltaren Gel four times a day and APAP prn Stop: NSAIDs Continue: OTC Ben-Gay like liniment

## 2021-11-01 NOTE — Assessment & Plan Note (Addendum)
Established problem Uncontrolled: current smoker Smoking 1/2 PPD for over 40 years Smoking [place] lights, ~ 1 mg / cig (10 mg/day nicotine) Denies chronic cough, SHOB, DOE patient's brother had success using Zyban  Plan LD Chest CT Lung CA screening ordered. AAA [place] screening ordered Nicotine Lozenger 4 mg/lozenge prn desire to smoke.  Patches not covered by patient's insurance 1-800-QUIT-Now line provided F/U in 4 weeks

## 2021-11-01 NOTE — Assessment & Plan Note (Signed)
Samuel Gross declined referral back to Dr Havery Moros to follow up his personal history of 16 polyps found on colonoscopy 01/2020.  Dr Havery Moros recommended Samuel Gross have a repeat colonoscopy in one year.  Samuel Gross did not scheule a follow up visit. He declined my offer of scheduling an appointment with Dr Havery Moros for colonoscopy to look for further evidence of premalignant polyps and colorectal cancer.  Samuel Gross declined to give a reason or barrier for not wanting to have a repeat colonoscopy.  His increase risk for CRC was explained to him, but he still chose not to have the repeat colonoscopy.

## 2021-11-05 ENCOUNTER — Ambulatory Visit
Admission: RE | Admit: 2021-11-05 | Discharge: 2021-11-05 | Disposition: A | Discharge status: Home / Self Care | Payer: Medicare Other | Source: Ambulatory Visit | Attending: Family Medicine | Admitting: Family Medicine

## 2021-11-05 DIAGNOSIS — Z87891 Personal history of nicotine dependence: Secondary | ICD-10-CM | POA: Diagnosis not present

## 2021-11-05 DIAGNOSIS — F17200 Nicotine dependence, unspecified, uncomplicated: Secondary | ICD-10-CM

## 2021-11-06 ENCOUNTER — Encounter: Payer: Self-pay | Admitting: Family Medicine

## 2021-11-12 ENCOUNTER — Encounter: Payer: Self-pay | Admitting: *Deleted

## 2021-11-21 ENCOUNTER — Other Ambulatory Visit: Payer: Self-pay

## 2021-11-21 DIAGNOSIS — F17210 Nicotine dependence, cigarettes, uncomplicated: Secondary | ICD-10-CM

## 2021-11-21 DIAGNOSIS — F17200 Nicotine dependence, unspecified, uncomplicated: Secondary | ICD-10-CM

## 2021-11-22 MED ORDER — NICOTINE POLACRILEX 4 MG MT LOZG: 4.0000 mg | LOZENGE | OROMUCOSAL | 3 refills | Status: DC | PRN

## 2021-11-26 ENCOUNTER — Ambulatory Visit
Admission: RE | Admit: 2021-11-26 | Discharge: 2021-11-26 | Disposition: A | Discharge status: Home / Self Care | Payer: Medicare Other | Source: Ambulatory Visit | Attending: Family Medicine | Admitting: Family Medicine

## 2021-11-26 DIAGNOSIS — F17210 Nicotine dependence, cigarettes, uncomplicated: Secondary | ICD-10-CM

## 2021-11-26 DIAGNOSIS — F17200 Nicotine dependence, unspecified, uncomplicated: Secondary | ICD-10-CM

## 2021-11-26 DIAGNOSIS — Z87891 Personal history of nicotine dependence: Secondary | ICD-10-CM | POA: Diagnosis not present

## 2021-11-28 ENCOUNTER — Encounter: Payer: Self-pay | Admitting: Family Medicine

## 2021-11-29 DIAGNOSIS — J432 Centrilobular emphysema: Secondary | ICD-10-CM | POA: Insufficient documentation

## 2021-12-12 ENCOUNTER — Ambulatory Visit (INDEPENDENT_AMBULATORY_CARE_PROVIDER_SITE_OTHER): Payer: Medicare Other | Admitting: Family Medicine

## 2021-12-12 ENCOUNTER — Encounter: Payer: Self-pay | Admitting: Family Medicine

## 2021-12-12 VITALS — BP 146/78 | HR 80 | Ht 69.0 in | Wt 172.0 lb

## 2021-12-12 DIAGNOSIS — M1712 Unilateral primary osteoarthritis, left knee: Secondary | ICD-10-CM

## 2021-12-12 DIAGNOSIS — F17200 Nicotine dependence, unspecified, uncomplicated: Secondary | ICD-10-CM

## 2021-12-12 DIAGNOSIS — I10 Essential (primary) hypertension: Secondary | ICD-10-CM

## 2021-12-12 DIAGNOSIS — J432 Centrilobular emphysema: Secondary | ICD-10-CM | POA: Diagnosis not present

## 2021-12-12 DIAGNOSIS — I700 Atherosclerosis of aorta: Secondary | ICD-10-CM

## 2021-12-12 MED ORDER — HYDROCHLOROTHIAZIDE 12.5 MG PO CAPS: 12.5000 mg | ORAL_CAPSULE | Freq: Every day | ORAL | 3 refills | Status: DC

## 2021-12-12 MED ORDER — AMLODIPINE BESYLATE 10 MG PO TABS: 10.0000 mg | ORAL_TABLET | Freq: Every day | ORAL | 3 refills | Status: DC

## 2021-12-12 NOTE — Patient Instructions (Signed)
Your blood pressure is much better, but still needs to decrease a little.  Please start taking a blood pressure medicine called HCTZ.  Take one capsule at the same time you take your Amlodipine.    Continue taking the Two tablets of Amlodipine 5 milligram every day.  Once the new Amlodipine 10 milliggram tablets show up from [employer], then start taking one 10 milligram Amlodipine every day instead of the Amlodipine 5 milligram  tablets.   Please come by for a nurse visit to check your blood pressure in the next two weeks.   Dr Ever Gustafson would like to see you back in 6 months.    Your lung CAT scan showed that you have Emphysema from smoking.    Stopping smoking is the only way to keep the emphysema from getting worse.    If your breathing gets worse, let Dr Lino Wickliff know.  There are inhalers you can use to help you with your breathing.    Emphysema (also called Chronic Obstructive Pulmonary Disease)  Chronic obstructive pulmonary disease (COPD) is a long-term (chronic) lung problem. When you have COPD, it is hard for air to get in and out of your lungs. Usually the condition gets worse over time, and your lungs will never return to normal. There are things you can do to keep yourself as healthy as possible. What are the causes? Smoking. This is the most common cause. Certain genes passed from parent to child (inherited). What increases the risk? Being exposed to secondhand smoke from cigarettes, pipes, or cigars. Being exposed to chemicals and other irritants, such as fumes and dust in the work environment. Having chronic lung conditions or infections. What are the signs or symptoms? Shortness of breath, especially during physical activity. A long-term cough with a large amount of thick mucus. Sometimes, the cough may not have any mucus (dry cough). Wheezing. Breathing quickly. Skin that looks gray or blue, especially in the fingers, toes, or lips. Feeling tired (fatigue). Weight  loss. Chest tightness. Having infections often. Episodes when breathing symptoms become much worse (exacerbations). At the later stages of this disease, you may have swelling in the ankles, feet, or legs. How is this treated? Taking medicines. Quitting smoking, if you smoke. Rehabilitation. This includes steps to make your body work better. It may involve a team of specialists. Doing exercises. Making changes to your diet. Using oxygen. Lung surgery. Lung transplant. Comfort measures (palliative care). Follow these instructions at home: Medicines Take over-the-counter and prescription medicines only as told by your doctor. Talk to your doctor before taking any cough or allergy medicines. You may need to avoid medicines that cause your lungs to be dry. Lifestyle If you smoke, stop smoking. Smoking makes the problem worse. Do not smoke or use any products that contain nicotine or tobacco. If you need help quitting, ask your doctor. Avoid being around things that make your breathing worse. This may include smoke, chemicals, and fumes. Stay active, but remember to rest as well. Learn and use tips on how to manage stress and control your breathing. Make sure you get enough sleep. Most adults need at least 7 hours of sleep every night. Eat healthy foods. Eat smaller meals more often. Rest before meals. Controlled breathing Learn and use tips on how to control your breathing as told by your doctor. Try: Breathing in (inhaling) through your nose for 1 second. Then, pucker your lips and breath out (exhale) through your lips for 2 seconds. Putting one hand on your  belly (abdomen). Breathe in slowly through your nose for 1 second. Your hand on your belly should move out. Pucker your lips and breathe out slowly through your lips. Your hand on your belly should move in as you breathe out.  Controlled coughing Learn and use controlled coughing to clear mucus from your lungs. Follow these  steps: Lean your head a little forward. Breathe in deeply. Try to hold your breath for 3 seconds. Keep your mouth slightly open while coughing 2 times. Spit any mucus out into a tissue. Rest and do the steps again 1 or 2 times as needed. General instructions Make sure you get all the shots (vaccines) that your doctor recommends. Ask your doctor about a flu shot and a pneumonia shot. Use oxygen therapy and pulmonary rehabilitation if told by your doctor. If you need home oxygen therapy, ask your doctor if you should buy a tool to measure your oxygen level (oximeter). Make a COPD action plan with your doctor. This helps you to know what to do if you feel worse than usual. Manage any other conditions you have as told by your doctor. Avoid going outside when it is very hot, cold, or humid. Avoid people who have a sickness you can catch (contagious). Keep all follow-up visits. Contact a doctor if: You cough up more mucus than usual. There is a change in the color or thickness of the mucus. It is harder to breathe than usual. Your breathing is faster than usual. You have trouble sleeping. You need to use your medicines more often than usual. You have trouble doing your normal activities such as getting dressed or walking around the house. Get help right away if: You have shortness of breath while resting. You have shortness of breath that stops you from: Being able to talk. Doing normal activities. Your chest hurts for longer than 5 minutes. Your skin color is more blue than usual. Your pulse oximeter shows that you have low oxygen for longer than 5 minutes. You have a fever. You feel too tired to breathe normally. These symptoms may represent a serious problem that is an emergency. Do not wait to see if the symptoms will go away. Get medical help right away. Call your local emergency services (911 in the [place] Do not drive yourself to the hospital. Summary Chronic obstructive pulmonary  disease (COPD) is a long-term lung problem. The way your lungs work will never return to normal. Usually the condition gets worse over time. There are things you can do to keep yourself as healthy as possible. Take over-the-counter and prescription medicines only as told by your doctor. If you smoke, stop. Smoking makes the problem worse. This information is not intended to replace advice given to you by your health care provider. Make sure you discuss any questions you have with your health care provider. Document Revised: 04/03/2020 Document Reviewed: 04/03/2020 Elsevier Patient Education  [place].

## 2021-12-13 ENCOUNTER — Encounter: Payer: Self-pay | Admitting: Family Medicine

## 2021-12-13 NOTE — Assessment & Plan Note (Signed)
Established problem Uncontrolled.  Patient is not at goal of smoking cessation.   He is in contemplative phase of change.  Insurance would not cover nicotine replacement therapies Discussed financial cost of smoking and savings from not smoking. Mr Samuel Gross did accept the cost of cigarettes  could be used to purchase nicotine replacement therapies.  Discussed causal role of smoking and his emphysema with its current symptoms and further progression in symptoms.

## 2021-12-13 NOTE — Assessment & Plan Note (Signed)
Established problem that has improved and has not meet goal of < 140/90.  Start HCTZ 12.5 mg daily Continue Amlodipine 10 mg daily Nurse visit in 2 weeks to check BP response RTC 1 month for physician visit

## 2021-12-13 NOTE — Assessment & Plan Note (Signed)
Established problem. Stable. Patient is at goal of being asymptomatic. No signs of complications, medication side effects, or red flags. Continue current medications and other regiments.

## 2021-12-13 NOTE — Assessment & Plan Note (Addendum)
Established problem that has improved and has not meet goal of > 50% reduction in pain.  Has avoidied use of NSAIDs given history of increase in serum creatinine Inadequate response to Voltaren gel  Recommendation: Schedule APAP  Future options: DG XR, Physical Therapy/exercise, Bracing, Cane, steroid IA inj, surgery

## 2021-12-13 NOTE — Progress Notes (Signed)
Samuel Gross is alone Sources of clinical information for visit is/are patient and Past medical record. Nursing assessment for this office visit was reviewed with the patient for accuracy and revision.     Previous Report(s) Reviewed: imaging reports: LD CT Chest and Aorta US;, lab reports, and office notes     12/12/2021    8:35 AM  Depression screen PHQ 2/9  Decreased Interest 0  Down, Depressed, Hopeless 0  PHQ - 2 Score 0  Altered sleeping 0  Tired, decreased energy 0  Change in appetite 0  Feeling bad or failure about yourself  0  Trouble concentrating 0  Moving slowly or fidgety/restless 0  Suicidal thoughts 0  PHQ-9 Score 0   Flowsheet Row Office Visit from 12/12/2021 in [place] Office Visit from 10/31/2021 in [place]  Thoughts that you would be better off dead, or of hurting yourself in some way Not at all Not at all  PHQ-9 Total Score 0 0          12/12/2021    8:35 AM 10/31/2021    8:41 AM 11/17/2019    8:32 AM 03/18/2018   10:01 AM 08/21/2016    9:31 AM  Fall Risk   Falls in the past year? 0 0 0 No No  Number falls in past yr:  0 0    Injury with Fall?  0 0         12/12/2021    8:35 AM 10/31/2021    8:41 AM 11/17/2019    8:32 AM  PHQ9 SCORE ONLY  PHQ-9 Total Score 0 0 0    Adult vaccines due  Topic Date Due   TETANUS/TDAP  01/31/2025    Health Maintenance Due  Topic Date Due   Zoster Vaccines- Shingrix (2 of 2) 03/29/2015   COLONOSCOPY (Pts 45-30yr Insurance coverage will need to be confirmed)  01/10/2021   COVID-19 Vaccine (6 - Pfizer series) 04/30/2021      History/P.E. limitations: none  Adult vaccines due  Topic Date Due   TETANUS/TDAP  01/31/2025   There are no preventive care reminders to display for this patient.  Health Maintenance Due  Topic Date Due   Zoster Vaccines- Shingrix (2 of 2) 03/29/2015   COLONOSCOPY (Pts 45-439yrInsurance coverage will need to be confirmed)  01/10/2021    COVID-19 Vaccine (6 - Pfizer series) 04/30/2021     Chief Complaint  Patient presents with   Hypertension   Knee Pain

## 2021-12-13 NOTE — Assessment & Plan Note (Signed)
Established problem. Stable. Patient is at goal of only mild, transient SHOB with significant exertion of work. He is not interested in rescue inhaler at this time. No signs of complications, medication side effects, or red flags. Discussed role of smoking in further deterioration of lung function and progression in symptoms of shortness of breath

## 2021-12-26 ENCOUNTER — Ambulatory Visit: Payer: Medicare Other

## 2021-12-31 ENCOUNTER — Ambulatory Visit (INDEPENDENT_AMBULATORY_CARE_PROVIDER_SITE_OTHER): Payer: Medicare Other

## 2021-12-31 VITALS — BP 162/78 | HR 83

## 2021-12-31 DIAGNOSIS — Z0130 Encounter for examination of blood pressure without abnormal findings: Secondary | ICD-10-CM

## 2021-12-31 NOTE — Progress Notes (Signed)
Patient here today for BP check.      Last BP was on 12/12/21 and was 146/78.  BP today is 150/78 with a pulse of 83. BP rechecked after ten minutes and was 162/78.    Checked BP in right arm with normal adult cuff.    Symptoms present: none.   Patient last took BP med this morning.  Spoke with Dr. McDiarmid while patient was in clinic. Advised that patient increase HCTZ to two 12.5 mg capsules daily.   Patient scheduled to return to clinic with Dr McDiarmid on 01/23/22.  Return precautions given.   Talbot Grumbling, RN

## 2021-12-31 NOTE — Patient Instructions (Signed)
Today we saw you for a blood pressure follow up. Your blood pressure is still elevated today. Dr. McDiarmid would like for you to increase your hydrochlorothiazide to two 12.5 mg capsules daily. Please let us know when you have about one week of this medication left so that we can send in a refill.   I have scheduled you with Dr. McDiarmid at 8:30 on 8/17. Please contact our office at least 24 hours in advance if you are unable to keep this appointment.   Have a nice day! Talbot Grumbling, RN

## 2022-01-23 ENCOUNTER — Ambulatory Visit (INDEPENDENT_AMBULATORY_CARE_PROVIDER_SITE_OTHER): Payer: Medicare Other | Admitting: Family Medicine

## 2022-01-23 ENCOUNTER — Encounter: Payer: Self-pay | Admitting: Family Medicine

## 2022-01-23 VITALS — BP 136/68 | HR 84 | Ht 69.0 in | Wt 170.0 lb

## 2022-01-23 DIAGNOSIS — I10 Essential (primary) hypertension: Secondary | ICD-10-CM | POA: Diagnosis not present

## 2022-01-23 MED ORDER — HYDROCHLOROTHIAZIDE 25 MG PO TABS
25.0000 mg | ORAL_TABLET | Freq: Every day | ORAL | 3 refills | Status: DC
Start: 2022-01-23 — End: 2022-10-06

## 2022-01-23 NOTE — Patient Instructions (Signed)
Please keep taking your HCTZ and Amlodipine like you are.  Your blood pressure measures after you take you blood pressure medications look good.  They are less than 140/90.   Congratulations on cutting down on the number of cigarettes you are smoking.  Any decrease in the number is considered a success by doctors.

## 2022-01-23 NOTE — Progress Notes (Signed)
Samuel Gross is alone Sources of clinical information for visit is/are patient. Nursing assessment for this office visit was reviewed with the patient for accuracy and revision.     Previous Report(s) Reviewed: none     01/23/2022    8:23 AM  Depression screen PHQ 2/9  Decreased Interest 0  Down, Depressed, Hopeless 0  PHQ - 2 Score 0  Altered sleeping 0  Tired, decreased energy 0  Change in appetite 0  Feeling bad or failure about yourself  0  Trouble concentrating 0  Moving slowly or fidgety/restless 0  Suicidal thoughts 0  PHQ-9 Score 0  Difficult doing work/chores Not difficult at all   [place] Visit from 01/23/2022 in [place] Office Visit from 12/12/2021 in [place] Office Visit from 10/31/2021 in [place]  Thoughts that you would be better off dead, or of hurting yourself in some way Not at all Not at all Not at all  PHQ-9 Total Score 0 0 0          01/23/2022    8:23 AM 12/12/2021    8:35 AM 10/31/2021    8:41 AM 11/17/2019    8:32 AM 03/18/2018   10:01 AM  Fall Risk   Falls in the past year? 0 0 0 0 No  Number falls in past yr:   0 0   Injury with Fall?   0 0        01/23/2022    8:23 AM 12/12/2021    8:35 AM 10/31/2021    8:41 AM  PHQ9 SCORE ONLY  PHQ-9 Total Score 0 0 0    Adult vaccines due  Topic Date Due   TETANUS/TDAP  01/31/2025    Health Maintenance Due  Topic Date Due   Zoster Vaccines- Shingrix (2 of 2) 03/29/2015   COLONOSCOPY (Pts 45-32yr Insurance coverage will need to be confirmed)  01/10/2021   COVID-19 Vaccine (6 - Pfizer series) 04/30/2021   INFLUENZA VACCINE  01/07/2022      History/P.E. limitations: none  Adult vaccines due  Topic Date Due   TETANUS/TDAP  01/31/2025   There are no preventive care reminders to display for this patient.  Health Maintenance Due  Topic Date Due   Zoster Vaccines- Shingrix (2 of 2) 03/29/2015   COLONOSCOPY (Pts  45-48yrInsurance coverage will need to be confirmed)  01/10/2021   COVID-19 Vaccine (6 - Pfizer series) 04/30/2021   INFLUENZA VACCINE  01/07/2022     Chief Complaint  Patient presents with   Hypertension     --------------------------------------------------------------------------------------------------------------------------------------------- Visit Problem List with A/P  Essential hypertension Established problem that has improved and has meet goal of 140.  Home BP readings after BP meds is 128-138/55-67 Continue current medication regiment HCTZ 25 mg daily and Amlodipine 10 mg daily.   CPT E&M Office Visit Time Before Visit; reviewing medical records (e.g. recent visits, labs, studies): 5 minutes During Visit (F2F time): 15  minutes After Visit (discussion with family or HCP, prescribing, ordering, referring, calling result/recommendations or documenting on same day): 5 minutes Total Visit Time: 20 minutes

## 2022-01-23 NOTE — Assessment & Plan Note (Signed)
Established problem that has improved and has meet goal of 140.  Home BP readings after BP meds is 128-138/55-67 Continue current medication regiment HCTZ 25 mg daily and Amlodipine 10 mg daily.

## 2022-02-28 ENCOUNTER — Encounter: Payer: Self-pay | Admitting: Family Medicine

## 2022-08-20 ENCOUNTER — Other Ambulatory Visit: Payer: Self-pay | Admitting: Family Medicine

## 2022-08-20 DIAGNOSIS — I10 Essential (primary) hypertension: Secondary | ICD-10-CM

## 2022-08-21 NOTE — Telephone Encounter (Signed)
[facility] before further refills  

## 2022-08-29 ENCOUNTER — Telehealth: Payer: Self-pay | Admitting: Family Medicine

## 2022-08-29 NOTE — Telephone Encounter (Signed)
Called patient to schedule Medicare Annual Wellness Visit (AWV). Left message for patient to call back and schedule Medicare Annual Wellness Visit (AWV).  Last date of AWV: AWVI eligible as of 11/07/2016  Please schedule an AWVI appointment at any time with [facility].  If any questions, please contact me at [phone].    Thank you,  [place] Direct dial  [phone]

## 2022-09-25 ENCOUNTER — Ambulatory Visit (INDEPENDENT_AMBULATORY_CARE_PROVIDER_SITE_OTHER): Payer: Medicare Other | Admitting: Family Medicine

## 2022-09-25 ENCOUNTER — Encounter: Payer: Self-pay | Admitting: Family Medicine

## 2022-09-25 VITALS — BP 138/75 | HR 69 | Ht 73.0 in | Wt 170.1 lb

## 2022-09-25 DIAGNOSIS — I700 Atherosclerosis of aorta: Secondary | ICD-10-CM | POA: Diagnosis not present

## 2022-09-25 DIAGNOSIS — Z23 Encounter for immunization: Secondary | ICD-10-CM

## 2022-09-25 DIAGNOSIS — Z87891 Personal history of nicotine dependence: Secondary | ICD-10-CM

## 2022-09-25 DIAGNOSIS — I10 Essential (primary) hypertension: Secondary | ICD-10-CM

## 2022-09-25 DIAGNOSIS — F17210 Nicotine dependence, cigarettes, uncomplicated: Secondary | ICD-10-CM

## 2022-09-25 DIAGNOSIS — E7800 Pure hypercholesterolemia, unspecified: Secondary | ICD-10-CM

## 2022-09-25 DIAGNOSIS — J432 Centrilobular emphysema: Secondary | ICD-10-CM | POA: Diagnosis not present

## 2022-09-25 NOTE — Assessment & Plan Note (Signed)
Established problem. Stable. Patient quit smoking, he is taking his statin, his BP control is improving He is working 4 hours a night in [employer].

## 2022-09-25 NOTE — Assessment & Plan Note (Signed)
Established problem. Stable. Denies significant shob with exertion Most importantly, Samuel Gross has quit smoking.  He has been abstinent for 8 months!!

## 2022-09-25 NOTE — Progress Notes (Signed)
Samuel Gross is alone Sources of clinical information for visit is/are patient. Nursing assessment for this office visit was reviewed with the patient for accuracy and revision.     Previous Report(s) Reviewed: none     09/25/2022    9:21 AM  Depression screen PHQ 2/9  Decreased Interest 0  Down, Depressed, Hopeless 0  PHQ - 2 Score 0  Altered sleeping 0  Tired, decreased energy 0  Change in appetite 0  Feeling bad or failure about yourself  0  Trouble concentrating 0  Moving slowly or fidgety/restless 0  Suicidal thoughts 0  PHQ-9 Score 0   Flowsheet Row Office Visit from 09/25/2022 in [facility] Office Visit from 01/23/2022 in [facility] Office Visit from 12/12/2021 in [facility]  Thoughts that you would be better off dead, or of hurting yourself in some way Not at all Not at all Not at all  PHQ-9 Total Score 0 0 0          09/25/2022    9:21 AM 01/23/2022    8:23 AM 12/12/2021    8:35 AM 10/31/2021    8:41 AM 11/17/2019    8:32 AM  Fall Risk   Falls in the past year? 0 0 0 0 0  Number falls in past yr: 0   0 0  Injury with Fall? 0   0 0       09/25/2022    9:21 AM 01/23/2022    8:23 AM 12/12/2021    8:35 AM  PHQ9 SCORE ONLY  PHQ-9 Total Score 0 0 0    There are no preventive care reminders to display for this patient.  Health Maintenance Due  Topic Date Due   Zoster Vaccines- Shingrix (2 of 2) 03/29/2015   Medicare Annual Wellness (AWV)  07/29/2017   COLONOSCOPY (Pts 45-53yrs Insurance coverage will need to be confirmed)  01/10/2021      History/P.E. limitations: none  There are no preventive care reminders to display for this patient. There are no preventive care reminders to display for this patient.  Health Maintenance Due  Topic Date Due   Zoster Vaccines- Shingrix (2 of 2) 03/29/2015   Medicare Annual Wellness (AWV)  07/29/2017   COLONOSCOPY (Pts 45-52yrs Insurance coverage will need to  be confirmed)  01/10/2021     Chief Complaint  Patient presents with   Hypertension     --------------------------------------------------------------------------------------------------------------------------------------------- Visit Problem List with A/P  Emphysema (HCC) Established problem. Stable. Denies significant shob with exertion Most importantly, Samuel Gross has quit smoking.  He has been abstinent for 8 months!!   Atherosclerosis of aorta (HCC) Established problem. Stable. Patient quit smoking, he is taking his statin, his BP control is improving He is working 4 hours a night in [employer].    Essential hypertension Established problem that has improved and has meet goal of <140/90 Home BP readings primarily in SBP 125 to 145 range with DBP in 60-70s range. .  Continue current medication regiment. RTC 3 months for recheck BP   Pure hypercholesterolemia Established problem Well Controlled Samuel Gross is taking his rosuvastatin 40 mg daily.  No signs of complications, medication side effects, or red flags. Continue current medications and other regiments.   Smoking greater than 20 pack years Annual LD Lung CT screening for Lung CA order submitted for 12/04/22  Time spent in pre-visit chart review,  visit and patient ed, and post-visit documentation was  30 minutes

## 2022-09-25 NOTE — Assessment & Plan Note (Signed)
Annual LD Lung CT screening for Lung CA order submitted for 12/04/22

## 2022-09-25 NOTE — Patient Instructions (Signed)
Keep taking your blood pressure medicines like you are doing.  Congratulations on stopping smoking In July it will be time to check your lungs for cancer with a CAT scan.  Dr Bryler Dibble will set it up.    SHINGLES VACCINATION  Chickenpox and shingles are related because they are caused by the same virus (varicella-zoster virus). After a person recovers from chickenpox, the virus stays dormant (inactive) in the body. It can reactivate years later and cause shingles.  CDC recommends that adults 50 years and older get two doses of the shingles vaccine called Shingrix (recombinant zoster vaccine) to prevent shingles and the complications from the disease.  Shingrix vaccination provides strong protection against shingles. In adults 50 years Shingrix is more than 90% effective at preventing shingles.  Studies show that Shingrix is safe. The vaccine helps your body create a strong defense against shingles. As a result, you are likely to have temporary side effects from getting the shots. Some people feel they are having a mild flu without the head cold symptoms.  The side effects might affect your ability to do normal daily activities for 2 to 3 days.  Getting the shot when you have a couple days without commitments is a good idea, in case you do feel under the weather.  Medicare prescription drug plans (Part D) usually cover all available vaccines needed to prevent illness, like the shingles (Shingrix) shot. Contact your Medicare drug plan If you are uncertain if they will cover the shingles vaccination.   Your pharmacist can give you Shingrix as a shot in your upper arm. You will need two Shingrix vaccinations to complete your inoculation against shingles.  The second injection is given at least 2 months after the first Shingrix vaccination was given.

## 2022-09-25 NOTE — Assessment & Plan Note (Addendum)
Established problem that has improved and has meet goal of <140/90 Home BP readings primarily in SBP 125 to 145 range with DBP in 60-70s range. .  Continue current medication regiment. RTC 3 months for recheck BP

## 2022-09-25 NOTE — Assessment & Plan Note (Signed)
Established problem Well Controlled Samuel Gross is taking his rosuvastatin 40 mg daily.  No signs of complications, medication side effects, or red flags. Continue current medications and other regiments.

## 2022-09-29 ENCOUNTER — Telehealth: Payer: Self-pay

## 2022-09-29 NOTE — Telephone Encounter (Signed)
Spoke with patient. Informed that Ct Scan at [facility] was made for June 27th at 10:00am 315 location. No prep is needed. Patient understood and wrote down time and date. Aquilla Solian, CMA

## 2022-10-04 ENCOUNTER — Other Ambulatory Visit: Payer: Self-pay | Admitting: Family Medicine

## 2022-10-04 DIAGNOSIS — E7800 Pure hypercholesterolemia, unspecified: Secondary | ICD-10-CM

## 2022-10-04 DIAGNOSIS — I10 Essential (primary) hypertension: Secondary | ICD-10-CM

## 2022-11-04 ENCOUNTER — Other Ambulatory Visit: Payer: Medicare Other

## 2022-12-04 ENCOUNTER — Ambulatory Visit
Admission: RE | Admit: 2022-12-04 | Discharge: 2022-12-04 | Disposition: A | Discharge status: Home / Self Care | Payer: Medicare Other | Source: Ambulatory Visit | Attending: Family Medicine | Admitting: Family Medicine

## 2022-12-04 DIAGNOSIS — Z87891 Personal history of nicotine dependence: Secondary | ICD-10-CM

## 2022-12-04 DIAGNOSIS — F17210 Nicotine dependence, cigarettes, uncomplicated: Secondary | ICD-10-CM

## 2022-12-06 ENCOUNTER — Encounter: Payer: Self-pay | Admitting: Family Medicine

## 2023-01-29 ENCOUNTER — Other Ambulatory Visit: Payer: Self-pay | Admitting: Family Medicine

## 2023-01-29 DIAGNOSIS — I10 Essential (primary) hypertension: Secondary | ICD-10-CM

## 2023-04-23 IMAGING — CT CT CHEST LUNG CANCER SCREENING LOW DOSE W/O CM
1 series · 15 of 31 positions shown, 19 images · non-contrast
Comparison: None Available.

CLINICAL DATA: 7-year-old male with 26 pack-year history of
smoking. Lung cancer screening.



[Series 2: ldct screening <30 bmi · axial · 0.74mm/px · z∈[-395,-65]mm · 15 of 72 slices shown, 19 images]
[im 3/72  mediastinal]
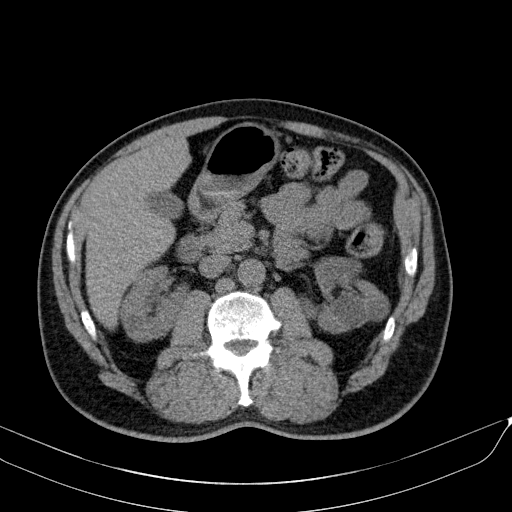
[im 3/72  lung]
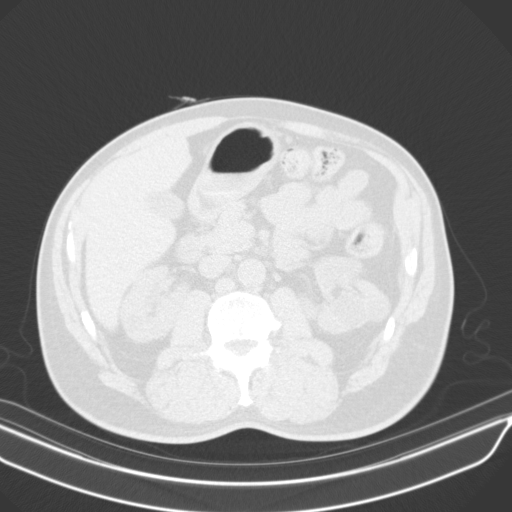
[im 8/72  lung]
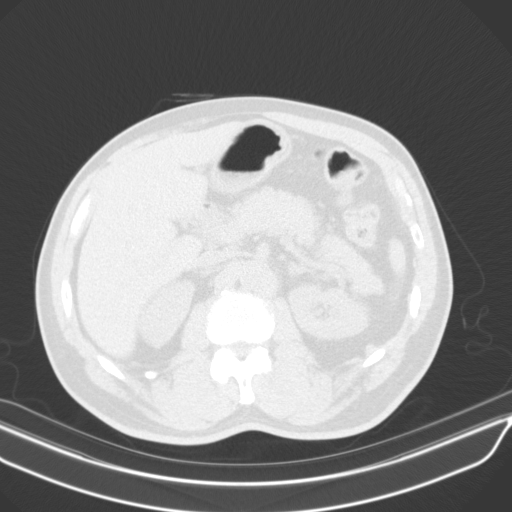
[im 14/72  lung]
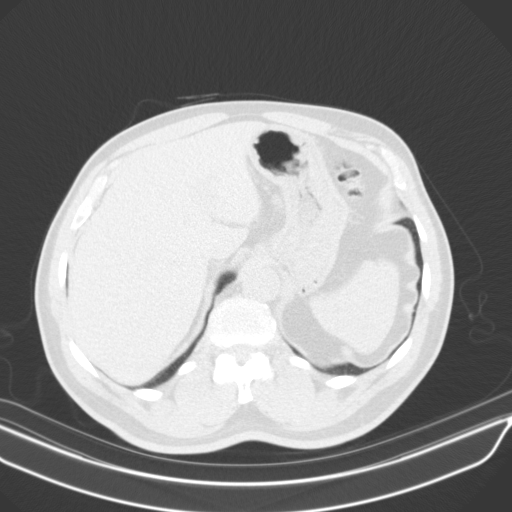
[im 16/72  lung]
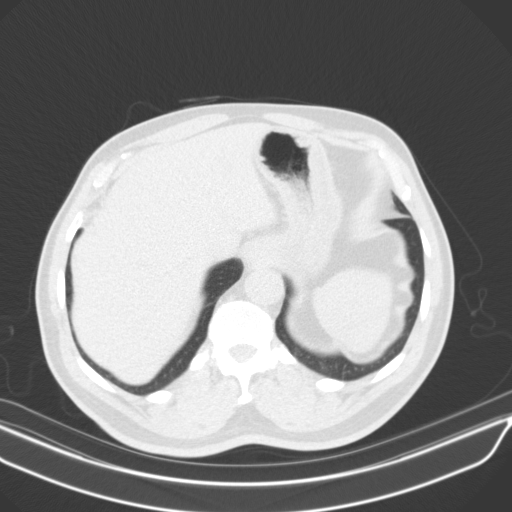
[im 22/72  mediastinal]
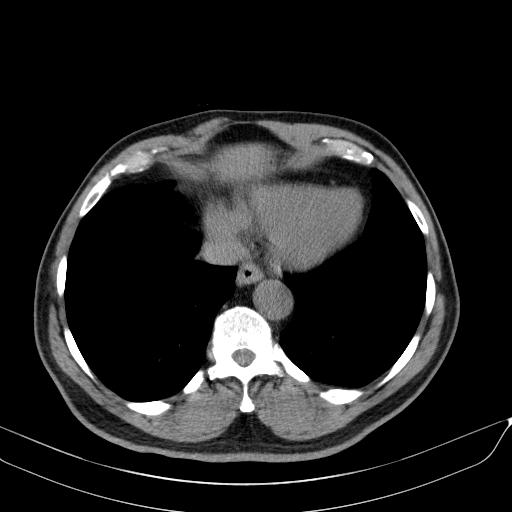
[im 22/72  lung]
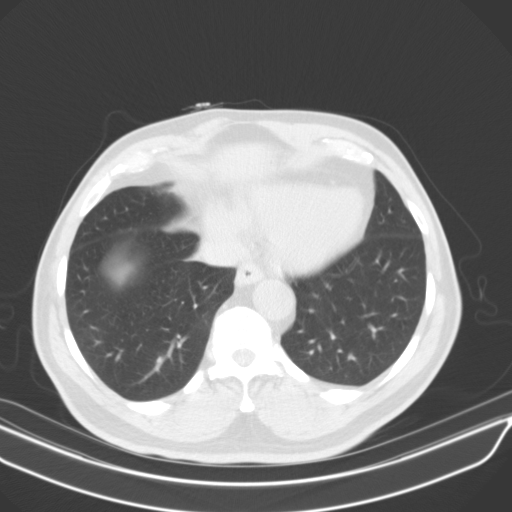
[im 27/72  lung]
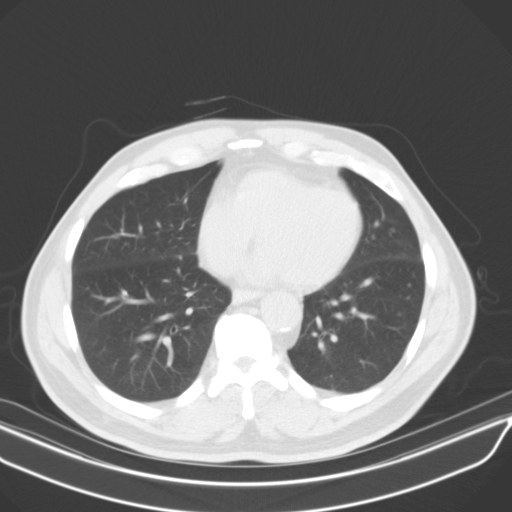
[im 32/72  lung]
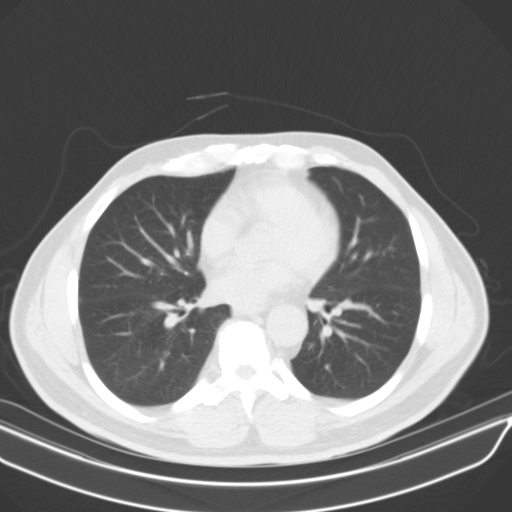
[im 37/72  lung]
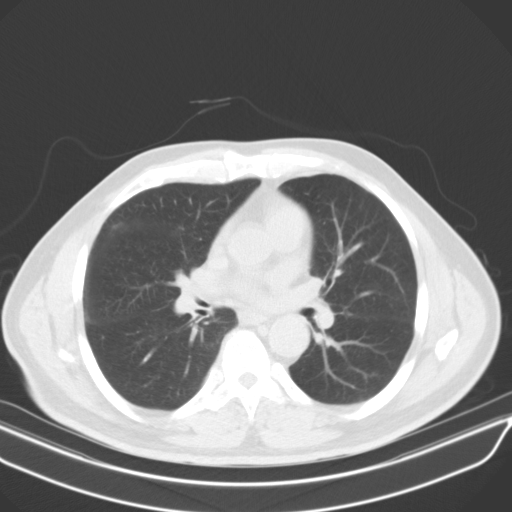
[im 40/72  mediastinal]
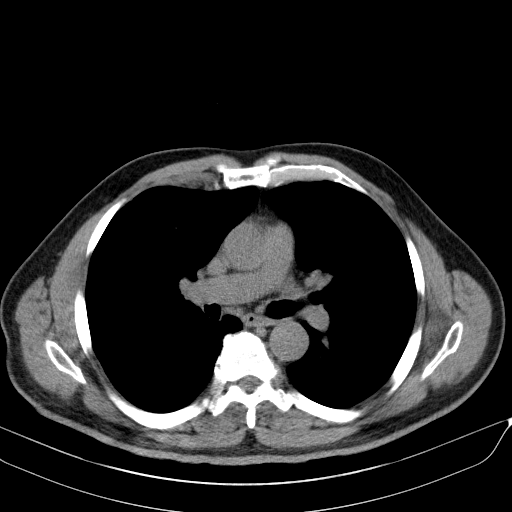
[im 40/72  lung]
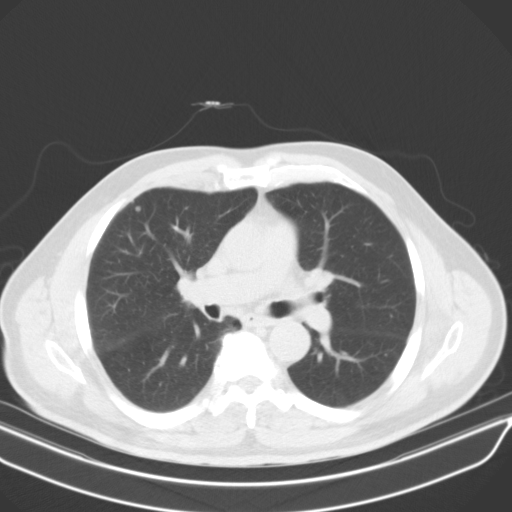
[im 45/72  lung]
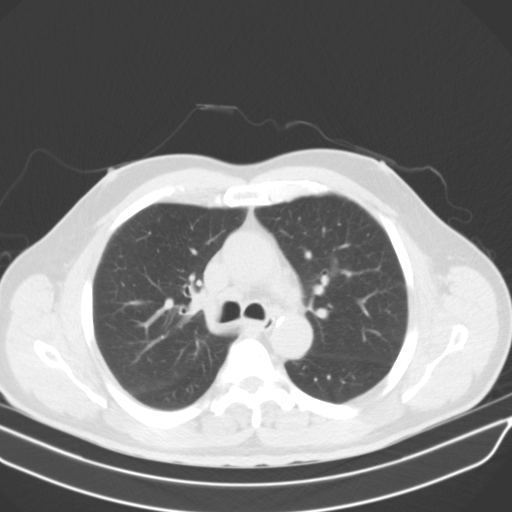
[im 50/72  lung]
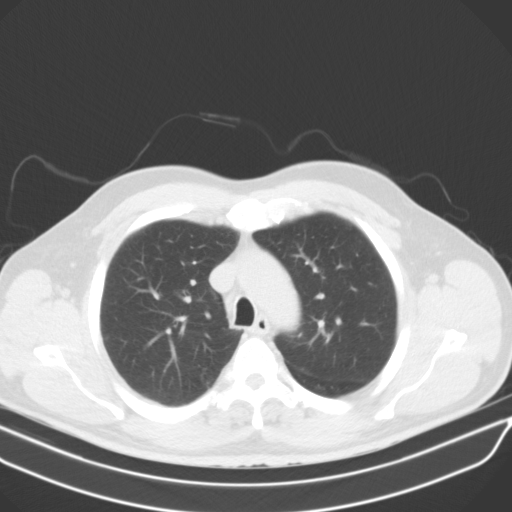
[im 56/72  lung]
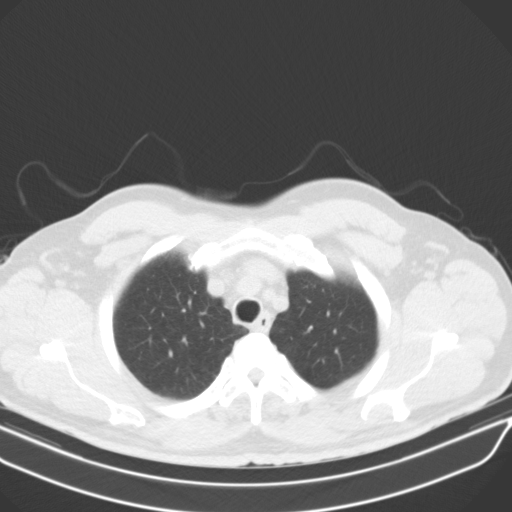
[im 58/72  mediastinal]
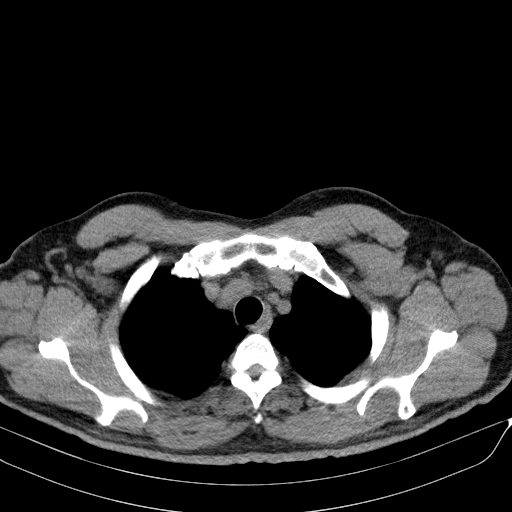
[im 58/72  lung]
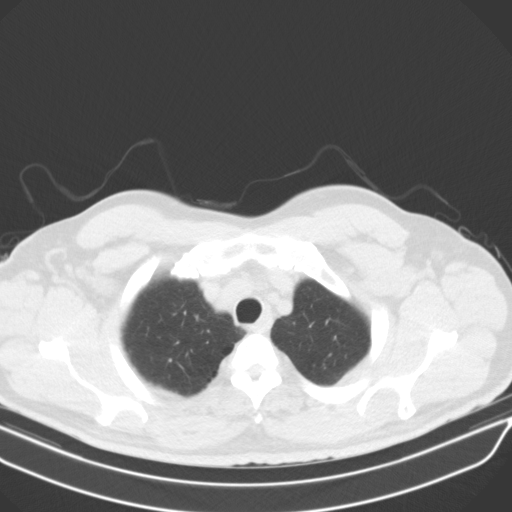
[im 64/72  lung]
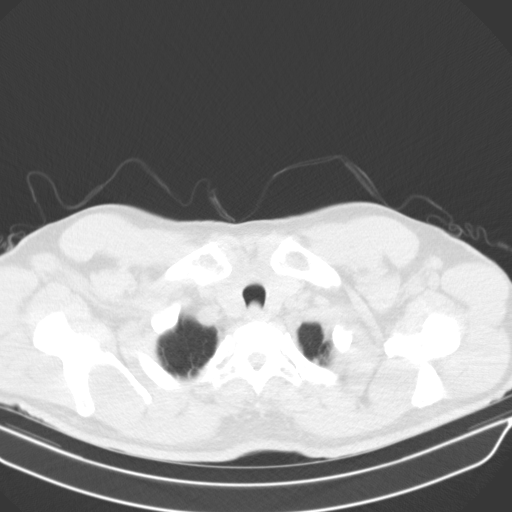
[im 69/72  lung]
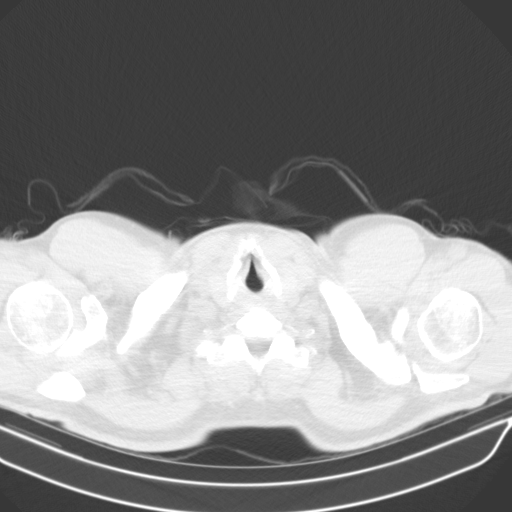

[15 of 31 positions shown; findings below may reference images not displayed]

FINDINGS: Cardiovascular: The heart size is normal. No substantial pericardial
effusion. Mild atherosclerotic calcification is noted in the wall of
the thoracic aorta.

Mediastinum/Nodes: No mediastinal lymphadenopathy. No evidence for
gross hilar lymphadenopathy although assessment is limited by the
lack of intravenous contrast on the current study. The esophagus has
normal imaging features. There is no axillary lymphadenopathy.

Lungs/Pleura: Centrilobular and paraseptal emphysema evident.
Scattered tiny bilateral pulmonary nodules are identified measuring
up to maximum volume derived diameter of 5.7 mm. No suspicious
pulmonary nodule or mass. No focal airspace consolidation. No
pleural effusion.

Upper Abdomen: Small cysts in the left kidney are present since
abdomen CT 07/01/2010 consistent with benign etiology. Patulous left
renal pelvis is also stable.

Musculoskeletal: No worrisome lytic or sclerotic osseous
abnormality.
IMPRESSION: Lung-RADS 2, benign appearance or behavior. Continue annual
screening with low-dose chest CT without contrast in 12 months.

Aortic Atherosclerosis (3JDTY-ERL.L) and Emphysema (3JDTY-2NY.F).

## 2023-07-30 DIAGNOSIS — H43811 Vitreous degeneration, right eye: Secondary | ICD-10-CM | POA: Diagnosis not present

## 2023-07-30 DIAGNOSIS — H2513 Age-related nuclear cataract, bilateral: Secondary | ICD-10-CM | POA: Diagnosis not present

## 2023-07-30 DIAGNOSIS — H25013 Cortical age-related cataract, bilateral: Secondary | ICD-10-CM | POA: Diagnosis not present

## 2023-09-20 ENCOUNTER — Other Ambulatory Visit: Payer: Self-pay | Admitting: Family Medicine

## 2023-09-20 DIAGNOSIS — I10 Essential (primary) hypertension: Secondary | ICD-10-CM

## 2023-09-20 DIAGNOSIS — E7800 Pure hypercholesterolemia, unspecified: Secondary | ICD-10-CM

## 2023-09-21 NOTE — Telephone Encounter (Signed)
[facility] before further refills  

## 2023-10-12 ENCOUNTER — Encounter: Payer: Self-pay | Admitting: Family Medicine

## 2023-11-12 ENCOUNTER — Ambulatory Visit (INDEPENDENT_AMBULATORY_CARE_PROVIDER_SITE_OTHER): Admitting: Family Medicine

## 2023-11-12 ENCOUNTER — Encounter: Payer: Self-pay | Admitting: Family Medicine

## 2023-11-12 VITALS — BP 156/74 | HR 76 | Temp 97.9°F | Ht 73.0 in | Wt 170.1 lb

## 2023-11-12 DIAGNOSIS — E7800 Pure hypercholesterolemia, unspecified: Secondary | ICD-10-CM | POA: Diagnosis not present

## 2023-11-12 DIAGNOSIS — M25561 Pain in right knee: Secondary | ICD-10-CM | POA: Diagnosis not present

## 2023-11-12 DIAGNOSIS — I10 Essential (primary) hypertension: Secondary | ICD-10-CM | POA: Diagnosis not present

## 2023-11-12 DIAGNOSIS — F17210 Nicotine dependence, cigarettes, uncomplicated: Secondary | ICD-10-CM | POA: Diagnosis not present

## 2023-11-12 DIAGNOSIS — M1711 Unilateral primary osteoarthritis, right knee: Secondary | ICD-10-CM | POA: Diagnosis not present

## 2023-11-12 DIAGNOSIS — J432 Centrilobular emphysema: Secondary | ICD-10-CM | POA: Diagnosis not present

## 2023-11-12 DIAGNOSIS — Z79899 Other long term (current) drug therapy: Secondary | ICD-10-CM | POA: Diagnosis not present

## 2023-11-12 MED ORDER — OLMESARTAN-AMLODIPINE-HCTZ 40-10-25 MG PO TABS: 1.0000 | ORAL_TABLET | ORAL | 3 refills | Status: DC

## 2023-11-12 MED ORDER — METHYLPREDNISOLONE ACETATE 40 MG/ML IJ SUSP
40.0000 mg | Freq: Once | INTRAMUSCULAR | Status: AC
  Administered 2023-11-12: 40 mg via INTRAMUSCULAR

## 2023-11-12 NOTE — Assessment & Plan Note (Signed)
 Established problem Tolerating rosuvastatin  40 mg daily Checking Renal function and LDL Plan continue rosuvastatin 

## 2023-11-12 NOTE — Assessment & Plan Note (Addendum)
 Established problem Uncontrolled.  Patient is not at goal of SBP < 140.     Latest Ref Rng & Units 11/24/2023    9:46 AM 11/12/2023   10:49 AM 10/31/2021   10:49 AM  BMP  Glucose 70 - 99 mg/dL 161  096  045   BUN 8 - 27 mg/dL 31  15  9    Creatinine 0.76 - 1.27 mg/dL 4.09  8.11  9.14   BUN/Creat Ratio 10 - 24 18  11  8    Sodium 134 - 144 mmol/L 141  132  141   Potassium 3.5 - 5.2 mmol/L 5.1  4.3  4.5   Chloride 96 - 106 mmol/L 102  96  103   CO2 20 - 29 mmol/L 21  20  22    Calcium  8.6 - 10.2 mg/dL 9.8  78.2  9.5      Start: Addition of Olmesartan  40 to amlodipine  10 and hydrochlorothiazide  25 daily in for of Tribenzor 40-10-25 one daily Lab visit next week to check renal function

## 2023-11-12 NOTE — Assessment & Plan Note (Signed)
 Low Dose CT ordered.

## 2023-11-12 NOTE — Patient Instructions (Signed)
 Your blood pressure is not quite under control.  We want your top blood pressure number to be less than 140 most of the time.    Dr Barrington Worley recommends you start a new tablet that contains your amlodipine  and hydrochlorothiazide  tablets all in one, along with a new medication called, olmesartan.    ONce you receive the olmesartan-amlodipine -hydrochlorothiazide  tablet in the mail, start taking this new tablet once a day and stop taking your amlodipine  tablet and stop taking the hydrochlorothiazide  tablet.  Please return next week to have a your kidney check to make sure the new medicine is not hurting it.    Diagnostic Radiology will call you to schedule your annual lung cancer screening CAT scan.  You will be due for the CAT scan after 11/29/23.

## 2023-11-12 NOTE — Progress Notes (Unsigned)
 Samuel Gross is alone Sources of clinical information for visit is/are patient. Nursing assessment for this office visit was reviewed with the patient for accuracy and revision.     Previous Report(s) Reviewed: none     11/12/2023    9:48 AM  Depression screen PHQ 2/9  Decreased Interest 0  Down, Depressed, Hopeless 0  PHQ - 2 Score 0  Altered sleeping 0  Tired, decreased energy 0  Change in appetite 0  Feeling bad or failure about yourself  0  Trouble concentrating 0  Moving slowly or fidgety/restless 0  Suicidal thoughts 0  PHQ-9 Score 0   Flowsheet Row Office Visit from 11/12/2023 in [facility] - [facility] Dept Of [place]. [facility] Office Visit from 09/25/2022 in [facility] - [facility] Dept Of [place]. [facility] Office Visit from 01/23/2022 in [facility] - [facility] Dept Of [place]. [facility]  Thoughts that you would be better off dead, or of hurting yourself in some way Not at all Not at all Not at all  PHQ-9 Total Score 0 0 0          11/12/2023    9:48 AM 09/25/2022    9:21 AM 01/23/2022    8:23 AM 12/12/2021    8:35 AM 10/31/2021    8:41 AM  Fall Risk   Falls in the past year? 0 0 0 0 0  Number falls in past yr: 0 0   0  Injury with Fall? 0 0   0       11/12/2023    9:48 AM 09/25/2022    9:21 AM 01/23/2022    8:23 AM  PHQ9 SCORE ONLY  PHQ-9 Total Score 0 0 0    There are no preventive care reminders to display for this patient.  Health Maintenance Due  Topic Date Due   Medicare Annual Wellness (AWV)  07/29/2017   Colonoscopy  01/10/2021   Lung Cancer Screening  12/04/2023      History/P.E. limitations: none  There are no preventive care reminders to display for this patient. There are no preventive care reminders to display for this patient.  Health Maintenance Due  Topic Date Due   Medicare Annual Wellness (AWV)  07/29/2017   Colonoscopy  01/10/2021   Lung Cancer Screening  12/04/2023      Chief Complaint  Patient presents with   Medical Management of Chronic Issues     --------------------------------------------------------------------------------------------------------------------------------------------- Visit Problem List with A/P  No problem-specific Assessment & Plan notes found for this encounter.

## 2023-11-13 NOTE — Assessment & Plan Note (Signed)
 Established problem Uncontrolled.  Patient is not at goal of significant decrease in pain and use of NSAIDs. Pain particular evident when working [employer] services (12 hours a week) Using BioFreeze & Icy Hot lineaments and APAP with mild effect  Patient requesting steroid injection into knee for reduction in pain  Right knee appears normal.  No erythma of edema at knee.  No increase warmth.   Procedure Diagnosis: Painful exacerbation of right knee osteoarthritis  Operator: Ena Harries Parisha Beaulac, MD Procedure: Right knee steroid injection Consent: yes Site: right medial knee approach Prep: betadine and alcohol Material: 1.2 inch 27 needle, 40 mg solumedrol with 4 ml lidocaine 1%  Actions: medial approach after skin cleansing injection Complications: none  F/u as needed.  Instructed to call for fever, increase knee pain or swelling.

## 2023-11-13 NOTE — Assessment & Plan Note (Signed)
 Established problem. Stable. Denies significant shob with exertion Most importantly, Mr Samuel Gross has quit smoking.  He has been abstinent for nearly 2 years  LD Lung CT ordered for end of month

## 2023-11-17 ENCOUNTER — Ambulatory Visit: Payer: Self-pay | Admitting: Family Medicine

## 2023-11-17 LAB — BASIC METABOLIC PANEL WITH GFR
BUN/Creatinine Ratio: 11 (ref 10–24)
BUN: 15 mg/dL (ref 8–27)
CO2: 20 mmol/L (ref 20–29)
Calcium: 10.1 mg/dL (ref 8.6–10.2)
Chloride: 96 mmol/L (ref 96–106)
Creatinine, Ser: 1.38 mg/dL — ABNORMAL HIGH (ref 0.76–1.27)
Glucose: 114 mg/dL — ABNORMAL HIGH (ref 70–99)
Potassium: 4.3 mmol/L (ref 3.5–5.2)
Sodium: 132 mmol/L — ABNORMAL LOW (ref 134–144)
eGFR: 54 mL/min/{1.73_m2} — ABNORMAL LOW (ref >59)

## 2023-11-17 LAB — LDL CHOLESTEROL, DIRECT: LDL Direct: 71 mg/dL (ref 0–99)

## 2023-11-24 ENCOUNTER — Other Ambulatory Visit

## 2023-11-24 DIAGNOSIS — Z79899 Other long term (current) drug therapy: Secondary | ICD-10-CM

## 2023-11-25 LAB — BASIC METABOLIC PANEL WITH GFR
BUN/Creatinine Ratio: 18 (ref 10–24)
BUN: 31 mg/dL — ABNORMAL HIGH (ref 8–27)
CO2: 21 mmol/L (ref 20–29)
Calcium: 9.8 mg/dL (ref 8.6–10.2)
Chloride: 102 mmol/L (ref 96–106)
Creatinine, Ser: 1.68 mg/dL — ABNORMAL HIGH (ref 0.76–1.27)
Glucose: 104 mg/dL — ABNORMAL HIGH (ref 70–99)
Potassium: 5.1 mmol/L (ref 3.5–5.2)
Sodium: 141 mmol/L (ref 134–144)
eGFR: 43 mL/min/{1.73_m2} — ABNORMAL LOW (ref >59)

## 2023-11-25 NOTE — Telephone Encounter (Signed)
 Mr Samuel Gross has not felt quite right since starting the Olm-Amlo-hydrochlorothiazide  two weeks ago.  His serum creatinine has risen, less than 30% from last serum creatinine.   He has been taking OTC NSAIDs about every other day for pain in his knees when he is working.   I recommended Mr Samuel Gross stop the Olm-Amlo-hydrochlorothiazide  combo pill. He should resume his Amlodipine  10 mg tab, one tab daily and hydrochlorothiazide  25 mg tab, one tab daily.  I asked Mr Samuel Gross to stop taking any pain medications except APAP.   He will follow up with me in about a month.  Will check Basic Metabolic Panel at that time.  May consider lower dose of daily Olmesartan .

## 2023-11-29 ENCOUNTER — Other Ambulatory Visit: Payer: Self-pay | Admitting: Family Medicine

## 2023-11-29 DIAGNOSIS — E7800 Pure hypercholesterolemia, unspecified: Secondary | ICD-10-CM

## 2023-12-22 ENCOUNTER — Inpatient Hospital Stay
Admission: RE | Admit: 2023-12-22 | Discharge: 2023-12-22 | Disposition: A | Discharge status: Home / Self Care | Source: Ambulatory Visit | Attending: Family Medicine | Admitting: Family Medicine

## 2023-12-22 DIAGNOSIS — Z87891 Personal history of nicotine dependence: Secondary | ICD-10-CM | POA: Diagnosis not present

## 2023-12-22 DIAGNOSIS — Z122 Encounter for screening for malignant neoplasm of respiratory organs: Secondary | ICD-10-CM | POA: Diagnosis not present

## 2023-12-22 DIAGNOSIS — F17210 Nicotine dependence, cigarettes, uncomplicated: Secondary | ICD-10-CM

## 2023-12-31 ENCOUNTER — Ambulatory Visit (INDEPENDENT_AMBULATORY_CARE_PROVIDER_SITE_OTHER): Admitting: Family Medicine

## 2023-12-31 ENCOUNTER — Encounter: Payer: Self-pay | Admitting: Family Medicine

## 2023-12-31 VITALS — BP 138/78 | HR 87 | Ht 73.0 in | Wt 168.5 lb

## 2023-12-31 DIAGNOSIS — I10 Essential (primary) hypertension: Secondary | ICD-10-CM

## 2023-12-31 DIAGNOSIS — Z79899 Other long term (current) drug therapy: Secondary | ICD-10-CM

## 2023-12-31 DIAGNOSIS — N1831 Chronic kidney disease, stage 3a: Secondary | ICD-10-CM | POA: Diagnosis not present

## 2023-12-31 NOTE — Assessment & Plan Note (Signed)
 Established problem. Adequate blood pressure control.  No evidence of new end organ damage.  Tolerating medication without significant adverse effects.  Plan to continue current blood pressure medication regiment.

## 2023-12-31 NOTE — Assessment & Plan Note (Signed)
 Established problem Checking Basic Metabolic Panel to see if stopping olmesartan  reduced serum creatinine

## 2023-12-31 NOTE — Patient Instructions (Signed)
 Your blood pressure looks very good.  Keep taking your Amlodipine  at night and your hydrochlorothiazide  in the morning.    Kept taking your rosuvastatin  like you are.   We are checking you kidneys today.  If the tests are abnormal, Dr Matej Sappenfield will call you.  If the results are normal, we will send you a letter through your MyChart.

## 2023-12-31 NOTE — Progress Notes (Signed)
 Samuel Gross is alone Sources of clinical information for visit is/are patient. Nursing assessment for this office visit was reviewed with the patient for accuracy and revision.     Previous Report(s) Reviewed: none     12/31/2023    9:59 AM  Depression screen PHQ 2/9  Decreased Interest 0  Down, Depressed, Hopeless 0  PHQ - 2 Score 0  Altered sleeping 0  Tired, decreased energy 0  Change in appetite 0  Feeling bad or failure about yourself  0  Trouble concentrating 0  Moving slowly or fidgety/restless 0  Suicidal thoughts 0  PHQ-9 Score 0   Flowsheet Row Office Visit from 12/31/2023 in [facility] - [facility] Dept Of [place]. [facility] Office Visit from 11/12/2023 in [facility] - A Dept [place]. [facility] Office Visit from 09/25/2022 in [facility] - A Dept Of [place]. [facility]  Thoughts that you would be better off dead, or of hurting yourself in some way Not at all Not at all Not at all  PHQ-9 Total Score 0 0 0       12/31/2023   10:10 AM 11/12/2023    9:48 AM 09/25/2022    9:21 AM 01/23/2022    8:23 AM 12/12/2021    8:35 AM  Fall Risk   Falls in the past year? 0 0 0 0 0  Number falls in past yr: 0 0 0    Injury with Fall? 0 0 0         12/31/2023    9:59 AM 11/12/2023    9:48 AM 09/25/2022    9:21 AM  PHQ9 SCORE ONLY  PHQ-9 Total Score 0 0 0    There are no preventive care reminders to display for this patient.  Health Maintenance Due  Topic Date Due   Medicare Annual Wellness (AWV)  07/29/2017   Colonoscopy  01/10/2021   COVID-19 Vaccine (9 - Pfizer risk 2024-25 season) 09/17/2023      History/P.E. limitations: none  There are no preventive care reminders to display for this patient. There are no preventive care reminders to display for this patient.  Health Maintenance Due  Topic Date Due   Medicare Annual Wellness (AWV)  07/29/2017   Colonoscopy  01/10/2021   COVID-19 Vaccine (9  - Pfizer risk 2024-25 season) 09/17/2023     Chief Complaint  Patient presents with   Medical Management of Chronic Issues     --------------------------------------------------------------------------------------------------------------------------------------------- Visit Problem List with A/P  Essential hypertension Established problem. Adequate blood pressure control.  No evidence of new end organ damage.  Tolerating medication without significant adverse effects.  Plan to continue current blood pressure medication regiment.    CKD stage 3a, GFR 45-59 ml/min (HCC) Established problem Checking Basic Metabolic Panel to see if stopping olmesartan  reduced serum creatinine   CPT E&M Office Visit Time Before Visit; reviewing medical records (e.g. recent visits, labs, studies): 5 minutes During Visit (F2F time): 15 minutes After Visit (discussion with family or HCP, prescribing, ordering, referring, calling result/recommendations or documenting on same day): 10 minutes Total Visit Time: 30 minutes

## 2024-01-01 LAB — BASIC METABOLIC PANEL WITH GFR
BUN/Creatinine Ratio: 12 (ref 10–24)
BUN: 20 mg/dL (ref 8–27)
CO2: 22 mmol/L (ref 20–29)
Calcium: 10 mg/dL (ref 8.6–10.2)
Chloride: 99 mmol/L (ref 96–106)
Creatinine, Ser: 1.71 mg/dL — ABNORMAL HIGH (ref 0.76–1.27)
Glucose: 108 mg/dL — ABNORMAL HIGH (ref 70–99)
Potassium: 4.7 mmol/L (ref 3.5–5.2)
Sodium: 140 mmol/L (ref 134–144)
eGFR: 42 mL/min/1.73 — ABNORMAL LOW (ref >59)

## 2024-03-11 ENCOUNTER — Other Ambulatory Visit: Payer: Self-pay

## 2024-03-11 MED ORDER — HYDROCHLOROTHIAZIDE 25 MG PO TABS: 25.0000 mg | ORAL_TABLET | Freq: Every day | ORAL | 0 refills | Status: DC

## 2024-03-11 MED ORDER — AMLODIPINE BESYLATE 10 MG PO TABS: 10.0000 mg | ORAL_TABLET | Freq: Every day | ORAL | 0 refills | Status: DC

## 2024-03-17 ENCOUNTER — Encounter: Payer: Self-pay | Admitting: Family Medicine

## 2024-04-14 ENCOUNTER — Other Ambulatory Visit: Payer: Self-pay | Admitting: Family Medicine

## 2024-05-03 ENCOUNTER — Other Ambulatory Visit: Payer: Self-pay | Admitting: Family Medicine

## 2024-08-04 ENCOUNTER — Ambulatory Visit: Admitting: Family Medicine
